# Patient Record
Sex: Female | Born: 1940 | Race: Black or African American | Hispanic: No | Smoking: Never smoker
Health system: Southern US, Community
[De-identification: ages and names within clinical notes are randomized; demographics above are authoritative.]

## PROBLEM LIST (undated history)

## (undated) DIAGNOSIS — I10 Essential (primary) hypertension: Secondary | ICD-10-CM

## (undated) DIAGNOSIS — M1712 Unilateral primary osteoarthritis, left knee: Secondary | ICD-10-CM

## (undated) HISTORY — PX: COLONOSCOPY W/ POLYPECTOMY: SHX1380

---

## 2009-03-15 ENCOUNTER — Encounter: Admission: RE | Admit: 2009-03-15 | Discharge: 2009-03-15 | Payer: Self-pay | Admitting: Internal Medicine

## 2010-02-27 ENCOUNTER — Other Ambulatory Visit: Payer: Self-pay | Admitting: Internal Medicine

## 2010-02-27 DIAGNOSIS — Z1231 Encounter for screening mammogram for malignant neoplasm of breast: Secondary | ICD-10-CM

## 2010-04-03 ENCOUNTER — Ambulatory Visit: Payer: Self-pay

## 2011-04-07 ENCOUNTER — Ambulatory Visit
Admission: RE | Admit: 2011-04-07 | Discharge: 2011-04-07 | Disposition: A | Discharge status: Home / Self Care | Payer: Medicare Other | Source: Ambulatory Visit | Attending: Internal Medicine | Admitting: Internal Medicine

## 2011-04-07 ENCOUNTER — Other Ambulatory Visit: Payer: Self-pay | Admitting: Internal Medicine

## 2011-04-07 DIAGNOSIS — M79606 Pain in leg, unspecified: Secondary | ICD-10-CM

## 2012-09-13 ENCOUNTER — Emergency Department (INDEPENDENT_AMBULATORY_CARE_PROVIDER_SITE_OTHER)
Admission: EM | Admit: 2012-09-13 | Discharge: 2012-09-13 | Disposition: A | Discharge status: Home / Self Care | Payer: Medicare Other | Source: Home / Self Care

## 2012-09-13 ENCOUNTER — Encounter (HOSPITAL_COMMUNITY): Payer: Self-pay | Admitting: *Deleted

## 2012-09-13 DIAGNOSIS — M542 Cervicalgia: Secondary | ICD-10-CM

## 2012-09-13 HISTORY — DX: Essential (primary) hypertension: I10

## 2012-09-13 MED ORDER — TRAMADOL HCL 50 MG PO TABS: 50.0000 mg | ORAL_TABLET | Freq: Four times a day (QID) | ORAL | Status: DC | PRN

## 2012-09-13 NOTE — ED Notes (Signed)
Pt  Reports  r  Shoulder  /  Arm  Pain  Started  Yesterday      Pt  Reports  May  Have  Injured  It  sev days  Ago  When  Playing with  Grandchild     / lifting toy

## 2012-09-13 NOTE — ED Provider Notes (Signed)
Emily Zuniga is a 72 y.o. female who presents to Urgent Care today for right shoulder and right thoracic pain starting yesterday. Patient denies any injury. She knows that she turned over in bed yesterday morning he awoke with sharp pain in her right shoulder girdle. The pain radiates to the upper right thoracic back and down the right arm to the elbow. The pain is worse with neck motion. The pain is not affected by shoulder motion or overhand reaching. She denies any pain radiating below the level of her elbow weakness or numbness or difficulty with coordination. She's tried some old diclofenac that she has for her knee arthritis which is only helped a bit. She denies any fevers or chills and feels well otherwise.    PMH reviewed. Hypertension History  Substance Use Topics  . Smoking status: Never Smoker   . Smokeless tobacco: Not on file  . Alcohol Use: No   ROS as above Medications reviewed. No current facility-administered medications for this encounter.   Current Outpatient Prescriptions  Medication Sig Dispense Refill  . AMLODIPINE BESYLATE PO Take by mouth.      . Calcium Carbonate-Vit D-Min (CALCIUM 1200 PO) Take by mouth.      . ENALAPRIL MALEATE PO Take by mouth.      . traMADol (ULTRAM) 50 MG tablet Take 1 tablet (50 mg total) by mouth every 6 (six) hours as needed for pain.  20 tablet  0    Exam:  BP 177/92  Pulse 60  Temp(Src) 98.3 F (36.8 C) (Oral)  Resp 18  SpO2 98% Gen: Well NAD HEENT: EOMI,  MMM Lungs: CTABL Nl WOB Heart: RRR no MRG Exts: Non edematous BL  LE, warm and well perfused.  Neck: Nontender to spinal midline. Normal range of motion pain with right rotation range of motion. Positive Spurling's test on right..  Shoulder: Bilateral shoulders with normal range of motion. Negative impingement test on right. Grip strength capillary refill and sensation intact bilateral upper extremities  No results found for this or any previous visit (from the past 24  hour(s)). No results found.  Assessment and Plan: 72 y.o. female with cervical myofascial pain. Doubtful for rotator cuff disease.  Plan to continue diclofenac and use tramadol and heating pad for pain as needed.  Followup with sports medicine if not improved in one or 2 weeks Discussed warning signs or symptoms. Please see discharge instructions. Patient expresses understanding.      Rodolph Bong, MD 09/13/12 1201

## 2012-10-11 ENCOUNTER — Other Ambulatory Visit: Payer: Self-pay | Admitting: Internal Medicine

## 2012-10-11 ENCOUNTER — Ambulatory Visit
Admission: RE | Admit: 2012-10-11 | Discharge: 2012-10-11 | Disposition: A | Discharge status: Home / Self Care | Payer: Medicare Other | Source: Ambulatory Visit | Attending: Internal Medicine | Admitting: Internal Medicine

## 2012-10-11 DIAGNOSIS — R200 Anesthesia of skin: Secondary | ICD-10-CM

## 2012-10-11 DIAGNOSIS — M542 Cervicalgia: Secondary | ICD-10-CM

## 2012-10-11 DIAGNOSIS — R52 Pain, unspecified: Secondary | ICD-10-CM

## 2014-03-16 ENCOUNTER — Encounter (HOSPITAL_COMMUNITY): Payer: Self-pay | Admitting: Emergency Medicine

## 2014-03-16 ENCOUNTER — Emergency Department (INDEPENDENT_AMBULATORY_CARE_PROVIDER_SITE_OTHER)
Admission: EM | Admit: 2014-03-16 | Discharge: 2014-03-16 | Disposition: A | Discharge status: Home / Self Care | Payer: Self-pay | Source: Home / Self Care | Attending: Emergency Medicine | Admitting: Emergency Medicine

## 2014-03-16 DIAGNOSIS — S39012A Strain of muscle, fascia and tendon of lower back, initial encounter: Secondary | ICD-10-CM

## 2014-03-16 DIAGNOSIS — R519 Headache, unspecified: Secondary | ICD-10-CM

## 2014-03-16 DIAGNOSIS — R51 Headache: Secondary | ICD-10-CM

## 2014-03-16 MED ORDER — METHOCARBAMOL 500 MG PO TABS: 500.0000 mg | ORAL_TABLET | Freq: Three times a day (TID) | ORAL | Status: DC
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
## 2014-03-16 NOTE — Discharge Instructions (Signed)
Do exercises twice daily followed by moist heat for 15 minutes. ° ° ° ° ° °Try to be as active as possible. ° °If no better in 2 weeks, follow up with orthopedist. ° ° °
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
## 2015-01-10 ENCOUNTER — Emergency Department (HOSPITAL_COMMUNITY)
Admission: EM | Admit: 2015-01-10 | Discharge: 2015-01-11 | Disposition: A | Discharge status: Home / Self Care | Payer: Medicare Other | Attending: Emergency Medicine | Admitting: Emergency Medicine

## 2015-01-10 ENCOUNTER — Other Ambulatory Visit: Payer: Self-pay

## 2015-01-10 ENCOUNTER — Encounter (HOSPITAL_COMMUNITY): Payer: Self-pay | Admitting: *Deleted

## 2015-01-10 DIAGNOSIS — Z79899 Other long term (current) drug therapy: Secondary | ICD-10-CM | POA: Diagnosis not present

## 2015-01-10 DIAGNOSIS — H81399 Other peripheral vertigo, unspecified ear: Secondary | ICD-10-CM | POA: Diagnosis not present

## 2015-01-10 DIAGNOSIS — I10 Essential (primary) hypertension: Secondary | ICD-10-CM | POA: Diagnosis not present

## 2015-01-10 DIAGNOSIS — R42 Dizziness and giddiness: Secondary | ICD-10-CM | POA: Diagnosis present

## 2015-01-10 LAB — BASIC METABOLIC PANEL
Anion gap: 9 (ref 5–15)
BUN: 15 mg/dL (ref 6–20)
CALCIUM: 9.9 mg/dL (ref 8.9–10.3)
CHLORIDE: 98 mmol/L — AB (ref 101–111)
CO2: 31 mmol/L (ref 22–32)
CREATININE: 1.04 mg/dL — AB (ref 0.44–1.00)
GFR, EST NON AFRICAN AMERICAN: 52 mL/min — AB (ref >60)
Glucose, Bld: 134 mg/dL — ABNORMAL HIGH (ref 65–99)
Potassium: 3.2 mmol/L — ABNORMAL LOW (ref 3.5–5.1)
SODIUM: 138 mmol/L (ref 135–145)

## 2015-01-10 LAB — CBC
HCT: 42.5 % (ref 36.0–46.0)
HEMOGLOBIN: 13.9 g/dL (ref 12.0–15.0)
MCH: 30 pg (ref 26.0–34.0)
MCHC: 32.7 g/dL (ref 30.0–36.0)
MCV: 91.8 fL (ref 78.0–100.0)
PLATELETS: 287 10*3/uL (ref 150–400)
RBC: 4.63 MIL/uL (ref 3.87–5.11)
RDW: 12.9 % (ref 11.5–15.5)
WBC: 7.1 10*3/uL (ref 4.0–10.5)

## 2015-01-10 NOTE — ED Notes (Signed)
The pt has had dizziness for the past hour  Now has a sl headache.  She has a history of dizziness.   No nausea no vomiting  She has dizziness when she stands

## 2015-01-10 NOTE — ED Notes (Signed)
No facial droop no arm drift no leg drift.  Equal grips

## 2015-01-10 NOTE — ED Provider Notes (Signed)
CSN: [id number]     Arrival date & time 01/10/15  2203 History     Chief Complaint  Patient presents with  . Dizziness      The history is provided by the patient. No language interpreter was used.   HPI Comments:  Emily Zuniga is a 74 y.o. female who presents to the Emergency Department complaining of dizziness. She has a history of hypertension. States that she has been in her usual state of health, and while watching TV 1 hour prior to arrival she developed sudden onset of dizziness. Stated that she had leaned her head back to rest from watching the TV and felt as if the room was spinning around her. She felt like she if she try to walk she would be very unsteady. States that when she would try to stand up her symptoms would worsen. Did not have any nausea or vomiting, double vision, speech changes, difficulty swallowing, numbness or weakness. No recent fevers, chills,cough, difficulty breathing, chest pain, syncope, or other recent illnesses.    Past Medical History  Diagnosis Date  . Hypertension    History reviewed. No pertinent past surgical history. History reviewed. No pertinent family history. Social History  Substance Use Topics  . Smoking status: Never Smoker   . Smokeless tobacco: None  . Alcohol Use: No   OB History    No data available     Review of Systems  All other systems reviewed and are negative.  10/14 systems reviewed and are negative other than those stated in the HPI    Allergies  Review of patient's allergies indicates no known allergies.  Home Medications   Prior to Admission medications   Medication Sig Start Date End Date Taking? Authorizing Provider  amLODipine (NORVASC) 10 MG tablet Take 10 mg by mouth daily.   Yes Historical Provider, MD  Calcium Carbonate-Vit D-Min (CALCIUM 1200 PO) Take 1 tablet by mouth daily.    Yes Historical Provider, MD  diclofenac (VOLTAREN) 75 MG EC tablet Take 75 mg by mouth 2 (two) times daily as needed for  moderate pain.   Yes Historical Provider, MD  enalapril-hydrochlorothiazide (VASERETIC) 10-25 MG tablet Take 1 tablet by mouth daily.   Yes Historical Provider, MD  meclizine (ANTIVERT) 25 MG tablet Take 1 tablet (25 mg total) by mouth 3 (three) times daily as needed for dizziness. 01/11/15   Lavera Guise, MD   Triage vitals: BP 156/91 mmHg  Pulse 90  Temp(Src) 97.8 F (36.6 C) (Oral)  Resp 20  SpO2 97% Physical Exam  Physical Exam  Nursing note and vitals reviewed. Constitutional: Well developed, well nourished, non-toxic, and in no acute distress Head: Normocephalic and atraumatic.  Mouth/Throat: Oropharynx is clear and moist.  Neck: Normal range of motion. Neck supple.  Cardiovascular: Normal rate and regular rhythm.   Pulmonary/Chest: Effort normal and breath sounds normal.  Abdominal: Soft. There is no tenderness. There is no rebound and no guarding.  Musculoskeletal: Normal range of motion.  Skin: Skin is warm and dry.  Psychiatric: Cooperative Neurological:  Alert, oriented to person, place, time, and situation. Memory grossly in tact. Fluent speech. No dysarthria or aphasia.  Cranial nerves: VF are full. Pupils are symmetric, and reactive to light. EOMI without nystagmus. No gaze deviation. Facial muscles symmetric with activation. Sensation to light touch over face in tact bilaterally. Hearing grossly in tact. Palate elevates symmetrically. Head turn and shoulder shrug are intact. Tongue midline.  Reflexes defered.  Muscle bulk and tone  normal. No pronator drift. Moves all extremities symmetrically. Sensation to light touch is in tact throughout in bilateral upper and lower extremities. Coordination reveals no dysmetria with finger to nose. Gait is narrow-based and steady but cautious   ED Course  Procedures  DIAGNOSTIC STUDIES: Oxygen Saturation is 97% on RA, normal by my interpretation.  COORDINATION OF CARE:  12:13 AM Discussed treatment plan with pt at bedside and  pt agreed to plan.  Labs Review Labs Reviewed  BASIC METABOLIC PANEL - Abnormal; Notable for the following:    Potassium 3.2 (*)    Chloride 98 (*)    Glucose, Bld 134 (*)    Creatinine, Ser 1.04 (*)    GFR calc non Af Amer 52 (*)    All other components within normal limits  CBC    Imaging Review No results found. I have personally reviewed and evaluated these images and lab results as part of my medical decision-making.   EKG Interpretation   Date/Time:  Thursday January 10 2015 22:09:45 EST Ventricular Rate:  85 PR Interval:  176 QRS Duration: 76 QT Interval:  378 QTC Calculation: 449 R Axis:   46 Text Interpretation:  Normal sinus rhythm Normal ECG No prior EKG.   Confirmed by Cyanne Delmar MD, Annabelle HarmanANA [phone]([id number]) on 01/11/2015 5:00:47 AM      MDM   Final diagnoses:  Peripheral vertigo, unspecified laterality   74 year old female who presents with dizziness, consistent with that of likely peripheral vertigo. Has minimal residual vertigo on my examination. No physical exam findings such as dysmetria, significant gait instability, abnormal vertical or bidirectional nystagmus, or other neurological deficits that would suggest central etiology of this. Given dose of meclizine, symptoms improved. Able to ambulate to restroom. Did briefly have recurrence of vertigo she reports when getting up from the toilet, but symptoms did improve on their own afterwards. States that she is comfortable with discharge home at this point. Given instructions for maneuvers at home as well as given a course of meclizine. Strict return and follow-up instructions are reviewed. She expressed understanding of all discharge instructions and felt comfortable to plan of care.   Lavera Guiseana Duo Chloe Miyoshi, MD 01/11/15 [phone]

## 2015-01-11 ENCOUNTER — Encounter (HOSPITAL_COMMUNITY): Payer: Self-pay | Admitting: Emergency Medicine

## 2015-01-11 MED ORDER — POTASSIUM CHLORIDE CRYS ER 20 MEQ PO TBCR
40.0000 meq | EXTENDED_RELEASE_TABLET | Freq: Once | ORAL | Status: AC
  Administered 2015-01-11: 40 meq via ORAL
  Filled 2015-01-11: qty 2

## 2015-01-11 MED ORDER — MECLIZINE HCL 25 MG PO TABS
25.0000 mg | ORAL_TABLET | Freq: Once | ORAL | Status: AC
  Administered 2015-01-11: 25 mg via ORAL
  Filled 2015-01-11: qty 1

## 2015-01-11 MED ORDER — MECLIZINE HCL 25 MG PO TABS: 25.0000 mg | ORAL_TABLET | Freq: Three times a day (TID) | ORAL | Status: DC | PRN

## 2015-01-11 NOTE — ED Notes (Signed)
Pt stable, ambulatory, states understanding of discharge instructions 

## 2015-01-11 NOTE — Discharge Instructions (Signed)
Return without fail for worsening symptoms, including difficulty speaking, vision changes, numbness or weakness, confusion, inability to walk, or any other symptoms concerning to you.  Benign Positional Vertigo Vertigo is the feeling that you or your surroundings are moving when they are not. Benign positional vertigo is the most common form of vertigo. The cause of this condition is not serious (is benign). This condition is triggered by certain movements and positions (is positional). This condition can be dangerous if it occurs while you are doing something that could endanger you or others, such as driving.  CAUSES In many cases, the cause of this condition is not known. It may be caused by a disturbance in an area of the inner ear that helps your brain to sense movement and balance. This disturbance can be caused by a viral infection (labyrinthitis), head injury, or repetitive motion. RISK FACTORS This condition is more likely to develop in: 1. Women. 2. People who are 56 years of age or older. SYMPTOMS Symptoms of this condition usually happen when you move your head or your eyes in different directions. Symptoms may start suddenly, and they usually last for less than a minute. Symptoms may include:  Loss of balance and falling.  Feeling like you are spinning or moving.  Feeling like your surroundings are spinning or moving.  Nausea and vomiting.  Blurred vision.  Dizziness.  Involuntary eye movement (nystagmus). Symptoms can be mild and cause only slight annoyance, or they can be severe and interfere with daily life. Episodes of benign positional vertigo may return (recur) over time, and they may be triggered by certain movements. Symptoms may improve over time. DIAGNOSIS This condition is usually diagnosed by medical history and a physical exam of the head, neck, and ears. You may be referred to a health care provider who specializes in ear, nose, and throat (ENT) problems  (otolaryngologist) or a provider who specializes in disorders of the nervous system (neurologist). You may have additional testing, including:  MRI.  A CT scan.  Eye movement tests. Your health care provider may ask you to change positions quickly while he or she watches you for symptoms of benign positional vertigo, such as nystagmus. Eye movement may be tested with an electronystagmogram (ENG), caloric stimulation, the Dix-Hallpike test, or the roll test.  An electroencephalogram (EEG). This records electrical activity in your brain.  Hearing tests. TREATMENT Usually, your health care provider will treat this by moving your head in specific positions to adjust your inner ear back to normal. Surgery may be needed in severe cases, but this is rare. In some cases, benign positional vertigo may resolve on its own in 2-4 weeks. HOME CARE INSTRUCTIONS Safety  Move slowly.Avoid sudden body or head movements.  Avoid driving.  Avoid operating heavy machinery.  Avoid doing any tasks that would be dangerous to you or others if a vertigo episode would occur.  If you have trouble walking or keeping your balance, try using a cane for stability. If you feel dizzy or unstable, sit down right away.  Return to your normal activities as told by your health care provider. Ask your health care provider what activities are safe for you. General Instructions  Take over-the-counter and prescription medicines only as told by your health care provider.  Avoid certain positions or movements as told by your health care provider.  Drink enough fluid to keep your urine clear or pale yellow.  Keep all follow-up visits as told by your health care provider. This  is important. SEEK MEDICAL CARE IF:  You have a fever.  Your condition gets worse or you develop new symptoms.  Your family or friends notice any behavioral changes.  Your nausea or vomiting gets worse.  You have numbness or a "pins and  needles" sensation. SEEK IMMEDIATE MEDICAL CARE IF:  You have difficulty speaking or moving.  You are always dizzy.  You faint.  You develop severe headaches.  You have weakness in your legs or arms.  You have changes in your hearing or vision.  You develop a stiff neck.  You develop sensitivity to light.   This information is not intended to replace advice given to you by your health care provider. Make sure you discuss any questions you have with your health care provider.   Document Released: 10/13/2005 Document Revised: 09/26/2014 Document Reviewed: 04/30/2014 Elsevier Interactive Patient Education 2016 [employer]  Teaching laboratory technicianpley Maneuver Self-Care WHAT IS THE EPLEY MANEUVER? The Epley maneuver is an exercise you can do to relieve symptoms of benign paroxysmal positional vertigo (BPPV). This condition is often just referred to as vertigo. BPPV is caused by the movement of tiny crystals (canaliths) inside your inner ear. The accumulation and movement of canaliths in your inner ear causes a sudden spinning sensation (vertigo) when you move your head to certain positions. Vertigo usually lasts about 30 seconds. BPPV usually occurs in just one ear. If you get vertigo when you lie on your left side, you probably have BPPV in your left ear. Your health care provider can tell you which ear is involved.  BPPV may be caused by a head injury. Many people older than 50 get BPPV for unknown reasons. If you have been diagnosed with BPPV, your health care provider may teach you how to do this maneuver. BPPV is not life threatening (benign) and usually goes away in time.  WHEN SHOULD I PERFORM THE EPLEY MANEUVER? You can do this maneuver at home whenever you have symptoms of vertigo. You may do the Epley maneuver up to 3 times a day until your symptoms of vertigo go away. HOW SHOULD I DO THE EPLEY MANEUVER? 3. Sit on the edge of a bed or table with your back straight. Your legs should be extended or  hanging over the edge of the bed or table.  4. Turn your head halfway toward the affected ear.  5. Lie backward quickly with your head turned until you are lying flat on your back. You may want to position a pillow under your shoulders.  6. Hold this position for 30 seconds. You may experience an attack of vertigo. This is normal. Hold this position until the vertigo stops. 7. Then turn your head to the opposite direction until your unaffected ear is facing the floor.  8. Hold this position for 30 seconds. You may experience an attack of vertigo. This is normal. Hold this position until the vertigo stops. 9. Now turn your whole body to the same side as your head. Hold for another 30 seconds.  10. You can then sit back up. ARE THERE RISKS TO THIS MANEUVER? In some cases, you may have other symptoms (such as changes in your vision, weakness, or numbness). If you have these symptoms, stop doing the maneuver and call your health care provider. Even if doing these maneuvers relieves your vertigo, you may still have dizziness. Dizziness is the sensation of light-headedness but without the sensation of movement. Even though the Epley maneuver may relieve your vertigo, it is  possible that your symptoms will return within 5 years. WHAT SHOULD I DO AFTER THIS MANEUVER? After doing the Epley maneuver, you can return to your normal activities. Ask your doctor if there is anything you should do at home to prevent vertigo. This may include:  Sleeping with two or more pillows to keep your head elevated.  Not sleeping on the side of your affected ear.  Getting up slowly from bed.  Avoiding sudden movements during the day.  Avoiding extreme head movement, like looking up or bending over.  Wearing a cervical collar to prevent sudden head movements. WHAT SHOULD I DO IF MY SYMPTOMS GET WORSE? Call your health care provider if your vertigo gets worse. Call your provider right way if you have other symptoms,  including:   Nausea.  Vomiting.  Headache.  Weakness.  Numbness.  Vision changes.   This information is not intended to replace advice given to you by your health care provider. Make sure you discuss any questions you have with your health care provider.   Document Released: 01/10/2013 Document Reviewed: 01/10/2013 Elsevier Interactive Patient Education [employer]

## 2017-02-24 ENCOUNTER — Other Ambulatory Visit: Payer: Self-pay | Admitting: Internal Medicine

## 2017-02-24 ENCOUNTER — Ambulatory Visit
Admission: RE | Admit: 2017-02-24 | Discharge: 2017-02-24 | Disposition: A | Discharge status: Home / Self Care | Payer: Medicare HMO | Source: Ambulatory Visit | Attending: Internal Medicine | Admitting: Internal Medicine

## 2017-02-24 DIAGNOSIS — R05 Cough: Secondary | ICD-10-CM

## 2017-02-24 DIAGNOSIS — R059 Cough, unspecified: Secondary | ICD-10-CM

## 2017-05-06 ENCOUNTER — Other Ambulatory Visit: Payer: Self-pay | Admitting: Orthopedic Surgery

## 2017-05-27 NOTE — Pre-Procedure Instructions (Signed)
Emily Zuniga  05/27/2017      [facility] [place], [place] - [address] [place] [address] Phone: [phone] Fax: [phone]    Your procedure is scheduled on Tues., Jun 08, 2017  Report to [facility] Admitting Entrance "A" at 5:30AM  Call this number if you have problems the morning of surgery:  [phone]   Remember:  Do not eat food or drink liquids after midnight.  Take these medicines the morning of surgery with A SIP OF WATER: AmLODipine (NORVASC)  If needed Acetaminophen (TYLENOL) for pain.  7 days before surgery (5/14), stop taking all Aspirins, Vitamins, Fish oils, and Herbal medications. Also stop all NSAIDS i.e. Advil, Ibuprofen, Motrin, Aleve, Anaprox, Naproxen, BC and Goody Powders.   Do not wear jewelry, make-up or nail polish.  Do not wear lotions, powders, perfumes, or deodorant.  Do not shave 48 hours prior to surgery.    Do not bring valuables to the hospital.  [facility] is not responsible for any belongings or valuables.  Contacts, dentures or bridgework may not be worn into surgery.  Leave your suitcase in the car.  After surgery it may be brought to your room.  For patients admitted to the hospital, discharge time will be determined by your treatment team.  Patients discharged the day of surgery will not be allowed to drive home.   Special instructions: [place]- Preparing For Surgery  Before surgery, you can play an important role. Because skin is not sterile, your skin needs to be as free of germs as possible. You can reduce the number of germs on your skin by washing with CHG (chlorahexidine gluconate) Soap before surgery.  CHG is an antiseptic cleaner which kills germs and bonds with the skin to continue killing germs even after washing.  Oral Hygiene is also important to reduce your risk of infection.  Remember - BRUSH YOUR TEETH THE MORNING OF SURGERY  Please do not use if  you have an allergy to CHG or antibacterial soaps. If your skin becomes reddened/irritated stop using the CHG.  Do not shave (including legs and underarms) for at least 48 hours prior to first CHG shower. It is OK to shave your face.  Please follow these instructions carefully.   1. Shower the NIGHT BEFORE SURGERY and the MORNING OF SURGERY with CHG.   2. If you chose to wash your hair, wash your hair first as usual with your normal shampoo.  3. After you shampoo, rinse your hair and body thoroughly to remove the shampoo.  4. Use CHG as you would any other liquid soap. You can apply CHG directly to the skin and wash gently with a scrungie or a clean washcloth.   5. Apply the CHG Soap to your body ONLY FROM THE NECK DOWN.  Do not use on open wounds or open sores. Avoid contact with your eyes, ears, mouth and genitals (private parts). Wash Face and genitals (private parts)  with your normal soap.  6. Wash thoroughly, paying special attention to the area where your surgery will be performed.  7. Thoroughly rinse your body with warm water from the neck down.  8. DO NOT shower/wash with your normal soap after using and rinsing off the CHG Soap.  9. Pat yourself dry with a CLEAN TOWEL.  10. Wear CLEAN PAJAMAS to bed the night before surgery, wear comfortable clothes the morning of surgery  11. Place CLEAN SHEETS  on your bed the night of your first shower and DO NOT SLEEP WITH PETS.  Day of Surgery: Do not apply any deodorants/lotions. Please wear clean clothes to the hospital/surgery center.  Remember to brush your teeth.    Please read over the following fact sheets that you were given. Pain Booklet, Coughing and Deep Breathing, Total Joint Packet, MRSA Information and Surgical Site Infection Prevention

## 2017-05-28 ENCOUNTER — Other Ambulatory Visit: Payer: Self-pay

## 2017-05-28 ENCOUNTER — Encounter (HOSPITAL_COMMUNITY): Payer: Self-pay

## 2017-05-28 ENCOUNTER — Encounter (HOSPITAL_COMMUNITY)
Admission: RE | Admit: 2017-05-28 | Discharge: 2017-05-28 | Disposition: A | Discharge status: Home / Self Care | Payer: Medicare HMO | Source: Ambulatory Visit | Attending: Orthopedic Surgery | Admitting: Orthopedic Surgery

## 2017-05-28 DIAGNOSIS — Z01812 Encounter for preprocedural laboratory examination: Secondary | ICD-10-CM | POA: Diagnosis present

## 2017-05-28 DIAGNOSIS — Z01810 Encounter for preprocedural cardiovascular examination: Secondary | ICD-10-CM | POA: Insufficient documentation

## 2017-05-28 HISTORY — DX: Unilateral primary osteoarthritis, left knee: M17.12

## 2017-05-28 LAB — BASIC METABOLIC PANEL
ANION GAP: 10 (ref 5–15)
BUN: 12 mg/dL (ref 6–20)
CHLORIDE: 102 mmol/L (ref 101–111)
CO2: 29 mmol/L (ref 22–32)
Calcium: 10 mg/dL (ref 8.9–10.3)
Creatinine, Ser: 1 mg/dL (ref 0.44–1.00)
GFR calc non Af Amer: 54 mL/min — ABNORMAL LOW (ref >60)
Glucose, Bld: 98 mg/dL (ref 65–99)
POTASSIUM: 3.7 mmol/L (ref 3.5–5.1)
Sodium: 141 mmol/L (ref 135–145)

## 2017-05-28 LAB — CBC
HCT: 43.1 % (ref 36.0–46.0)
HEMOGLOBIN: 14.3 g/dL (ref 12.0–15.0)
MCH: 30 pg (ref 26.0–34.0)
MCHC: 33.2 g/dL (ref 30.0–36.0)
MCV: 90.5 fL (ref 78.0–100.0)
Platelets: 243 10*3/uL (ref 150–400)
RBC: 4.76 MIL/uL (ref 3.87–5.11)
RDW: 12.7 % (ref 11.5–15.5)
WBC: 4.9 10*3/uL (ref 4.0–10.5)

## 2017-05-28 LAB — SURGICAL PCR SCREEN
MRSA, PCR: NEGATIVE
Staphylococcus aureus: NEGATIVE

## 2017-05-28 NOTE — Progress Notes (Signed)
PCP - Dr. Burton Apley  Cardiologist - Denies  Chest x-ray - Denies  EKG - 05/28/17  Stress Test - Denies  ECHO - Denies  Cardiac Cath - Denies  Sleep Study - Denies CPAP - None  LABS- 05/28/17: CBC, BMP   Anesthesia- No  Pt denies having chest pain, sob, or fever at this time. All instructions explained to the pt, with a verbal understanding of the material. Pt agrees to go over the instructions while at home for a better understanding. The opportunity to ask questions was provided.

## 2017-06-08 ENCOUNTER — Inpatient Hospital Stay (HOSPITAL_COMMUNITY): Payer: Medicare HMO | Admitting: Anesthesiology

## 2017-06-08 ENCOUNTER — Inpatient Hospital Stay (HOSPITAL_COMMUNITY): Payer: Medicare HMO

## 2017-06-08 ENCOUNTER — Encounter (HOSPITAL_COMMUNITY)
Admission: RE | Disposition: A | Discharge status: Home Health | Payer: Self-pay | Source: Ambulatory Visit | Attending: Orthopedic Surgery

## 2017-06-08 ENCOUNTER — Encounter (HOSPITAL_COMMUNITY): Payer: Self-pay | Admitting: Orthopedic Surgery

## 2017-06-08 ENCOUNTER — Other Ambulatory Visit: Payer: Self-pay

## 2017-06-08 ENCOUNTER — Inpatient Hospital Stay (HOSPITAL_COMMUNITY)
Admission: RE | Admit: 2017-06-08 | Discharge: 2017-06-10 | DRG: 470 | Disposition: A | Discharge status: Home Health | Payer: Medicare HMO | Source: Ambulatory Visit | Attending: Orthopedic Surgery | Admitting: Orthopedic Surgery

## 2017-06-08 DIAGNOSIS — Z96652 Presence of left artificial knee joint: Secondary | ICD-10-CM

## 2017-06-08 DIAGNOSIS — M1712 Unilateral primary osteoarthritis, left knee: Secondary | ICD-10-CM | POA: Diagnosis present

## 2017-06-08 DIAGNOSIS — Z8249 Family history of ischemic heart disease and other diseases of the circulatory system: Secondary | ICD-10-CM | POA: Diagnosis not present

## 2017-06-08 DIAGNOSIS — Z6841 Body Mass Index (BMI) 40.0 and over, adult: Secondary | ICD-10-CM

## 2017-06-08 DIAGNOSIS — I10 Essential (primary) hypertension: Secondary | ICD-10-CM | POA: Diagnosis present

## 2017-06-08 DIAGNOSIS — Z96659 Presence of unspecified artificial knee joint: Secondary | ICD-10-CM

## 2017-06-08 HISTORY — DX: Unilateral primary osteoarthritis, left knee: M17.12

## 2017-06-08 HISTORY — PX: TOTAL KNEE ARTHROPLASTY: SHX125

## 2017-06-08 SURGERY — ARTHROPLASTY, KNEE, TOTAL
Anesthesia: Spinal | Site: Knee | Laterality: Left

## 2017-06-08 MED ORDER — ACETAMINOPHEN 325 MG PO TABS
325.0000 mg | ORAL_TABLET | Freq: Four times a day (QID) | ORAL | Status: DC | PRN
  Administered 2017-06-09 – 2017-06-10 (×2): 650 mg via ORAL
  Filled 2017-06-08 (×2): qty 2

## 2017-06-08 MED ORDER — HYDROCHLOROTHIAZIDE 25 MG PO TABS
25.0000 mg | ORAL_TABLET | Freq: Every day | ORAL | Status: DC
  Administered 2017-06-08 – 2017-06-10 (×3): 25 mg via ORAL
  Filled 2017-06-08 (×3): qty 1

## 2017-06-08 MED ORDER — BACLOFEN 10 MG PO TABS: 10.0000 mg | ORAL_TABLET | Freq: Three times a day (TID) | ORAL | 0 refills | Status: DC

## 2017-06-08 MED ORDER — DEXAMETHASONE SODIUM PHOSPHATE 10 MG/ML IJ SOLN
INTRAMUSCULAR | Status: AC
  Filled 2017-06-08: qty 1

## 2017-06-08 MED ORDER — METOCLOPRAMIDE HCL 5 MG/ML IJ SOLN: 5.0000 mg | Freq: Three times a day (TID) | INTRAMUSCULAR | Status: DC | PRN

## 2017-06-08 MED ORDER — 0.9 % SODIUM CHLORIDE (POUR BTL) OPTIME
TOPICAL | Status: DC | PRN
  Administered 2017-06-08: 1000 mL

## 2017-06-08 MED ORDER — LIDOCAINE 2% (20 MG/ML) 5 ML SYRINGE
INTRAMUSCULAR | Status: AC
  Filled 2017-06-08: qty 5

## 2017-06-08 MED ORDER — LACTATED RINGERS IV SOLN
INTRAVENOUS | Status: DC | PRN
  Administered 2017-06-08 (×2): via INTRAVENOUS

## 2017-06-08 MED ORDER — DOCUSATE SODIUM 100 MG PO CAPS
100.0000 mg | ORAL_CAPSULE | Freq: Two times a day (BID) | ORAL | Status: DC
  Administered 2017-06-08 – 2017-06-10 (×4): 100 mg via ORAL
  Filled 2017-06-08 (×4): qty 1

## 2017-06-08 MED ORDER — KETOROLAC TROMETHAMINE 15 MG/ML IJ SOLN
7.5000 mg | Freq: Four times a day (QID) | INTRAMUSCULAR | Status: AC
  Administered 2017-06-08 – 2017-06-09 (×4): 7.5 mg via INTRAVENOUS
  Filled 2017-06-08 (×4): qty 1

## 2017-06-08 MED ORDER — MIDAZOLAM HCL 5 MG/5ML IJ SOLN
INTRAMUSCULAR | Status: DC | PRN
  Administered 2017-06-08: 1 mg via INTRAVENOUS

## 2017-06-08 MED ORDER — CALCIUM CARBONATE-VITAMIN D 500-200 MG-UNIT PO TABS
1.0000 | ORAL_TABLET | Freq: Every day | ORAL | Status: DC
  Administered 2017-06-08 – 2017-06-10 (×3): 1 via ORAL
  Filled 2017-06-08 (×6): qty 1

## 2017-06-08 MED ORDER — BUPIVACAINE HCL (PF) 0.25 % IJ SOLN
INTRAMUSCULAR | Status: DC | PRN
  Administered 2017-06-08: 30 mL

## 2017-06-08 MED ORDER — BUPIVACAINE HCL (PF) 0.25 % IJ SOLN
INTRAMUSCULAR | Status: AC
  Filled 2017-06-08: qty 30

## 2017-06-08 MED ORDER — ALUM & MAG HYDROXIDE-SIMETH 200-200-20 MG/5ML PO SUSP: 30.0000 mL | ORAL | Status: DC | PRN

## 2017-06-08 MED ORDER — ENALAPRIL-HYDROCHLOROTHIAZIDE 10-25 MG PO TABS: 1.0000 | ORAL_TABLET | Freq: Every day | ORAL | Status: DC

## 2017-06-08 MED ORDER — ENALAPRIL MALEATE 5 MG PO TABS
10.0000 mg | ORAL_TABLET | Freq: Every day | ORAL | Status: DC
  Administered 2017-06-08 – 2017-06-10 (×3): 10 mg via ORAL
  Filled 2017-06-08 (×3): qty 2

## 2017-06-08 MED ORDER — ASPIRIN EC 325 MG PO TBEC
325.0000 mg | DELAYED_RELEASE_TABLET | Freq: Two times a day (BID) | ORAL | Status: DC
  Administered 2017-06-08 – 2017-06-10 (×4): 325 mg via ORAL
  Filled 2017-06-08 (×4): qty 1

## 2017-06-08 MED ORDER — ZOLPIDEM TARTRATE 5 MG PO TABS: 5.0000 mg | ORAL_TABLET | Freq: Every evening | ORAL | Status: DC | PRN

## 2017-06-08 MED ORDER — TRANEXAMIC ACID 1000 MG/10ML IV SOLN
1000.0000 mg | Freq: Once | INTRAVENOUS | Status: AC
  Administered 2017-06-08: 1000 mg via INTRAVENOUS
  Filled 2017-06-08: qty 10

## 2017-06-08 MED ORDER — POTASSIUM CHLORIDE IN NACL 20-0.45 MEQ/L-% IV SOLN
INTRAVENOUS | Status: DC
  Administered 2017-06-08: 13:00:00 via INTRAVENOUS
  Filled 2017-06-08 (×2): qty 1000

## 2017-06-08 MED ORDER — KETOROLAC TROMETHAMINE 30 MG/ML IJ SOLN
INTRAMUSCULAR | Status: DC | PRN
  Administered 2017-06-08: 30 mg via INTRAMUSCULAR

## 2017-06-08 MED ORDER — AMLODIPINE BESYLATE 5 MG PO TABS
5.0000 mg | ORAL_TABLET | Freq: Every day | ORAL | Status: DC
  Administered 2017-06-09 – 2017-06-10 (×2): 5 mg via ORAL
  Filled 2017-06-08 (×2): qty 1

## 2017-06-08 MED ORDER — ACETAMINOPHEN 500 MG PO TABS
500.0000 mg | ORAL_TABLET | Freq: Four times a day (QID) | ORAL | Status: AC
  Administered 2017-06-09 (×2): 500 mg via ORAL
  Filled 2017-06-08 (×3): qty 1

## 2017-06-08 MED ORDER — ASPIRIN EC 325 MG PO TBEC: 325.0000 mg | DELAYED_RELEASE_TABLET | Freq: Every day | ORAL | 0 refills | Status: DC

## 2017-06-08 MED ORDER — METHOCARBAMOL 500 MG PO TABS
500.0000 mg | ORAL_TABLET | Freq: Four times a day (QID) | ORAL | Status: DC | PRN
  Administered 2017-06-08 – 2017-06-10 (×4): 500 mg via ORAL
  Filled 2017-06-08 (×4): qty 1

## 2017-06-08 MED ORDER — CEFAZOLIN SODIUM-DEXTROSE 2-4 GM/100ML-% IV SOLN
2.0000 g | Freq: Four times a day (QID) | INTRAVENOUS | Status: AC
  Administered 2017-06-08 (×2): 2 g via INTRAVENOUS
  Filled 2017-06-08 (×2): qty 100

## 2017-06-08 MED ORDER — CEFAZOLIN SODIUM-DEXTROSE 2-4 GM/100ML-% IV SOLN
2.0000 g | INTRAVENOUS | Status: AC
  Administered 2017-06-08: 2 g via INTRAVENOUS

## 2017-06-08 MED ORDER — ONDANSETRON HCL 4 MG/2ML IJ SOLN: 4.0000 mg | Freq: Four times a day (QID) | INTRAMUSCULAR | Status: DC | PRN

## 2017-06-08 MED ORDER — LIDOCAINE HCL (CARDIAC) PF 100 MG/5ML IV SOSY
PREFILLED_SYRINGE | INTRAVENOUS | Status: DC | PRN
  Administered 2017-06-08: 40 mg via INTRATRACHEAL

## 2017-06-08 MED ORDER — ONDANSETRON HCL 4 MG PO TABS: 4.0000 mg | ORAL_TABLET | Freq: Three times a day (TID) | ORAL | 0 refills | Status: DC | PRN

## 2017-06-08 MED ORDER — SODIUM CHLORIDE 0.9 % IR SOLN
Status: DC | PRN
  Administered 2017-06-08: 3000 mL

## 2017-06-08 MED ORDER — CHLORHEXIDINE GLUCONATE 4 % EX LIQD: 60.0000 mL | Freq: Once | CUTANEOUS | Status: DC

## 2017-06-08 MED ORDER — PROPOFOL 500 MG/50ML IV EMUL
INTRAVENOUS | Status: DC | PRN
  Administered 2017-06-08: 75 ug/kg/min via INTRAVENOUS
  Administered 2017-06-08: 09:00:00 via INTRAVENOUS

## 2017-06-08 MED ORDER — CEFAZOLIN SODIUM-DEXTROSE 2-4 GM/100ML-% IV SOLN
INTRAVENOUS | Status: AC
  Filled 2017-06-08: qty 100

## 2017-06-08 MED ORDER — ADULT MULTIVITAMIN W/MINERALS CH
1.0000 | ORAL_TABLET | Freq: Every day | ORAL | Status: DC
  Administered 2017-06-08 – 2017-06-10 (×3): 1 via ORAL
  Filled 2017-06-08 (×6): qty 1

## 2017-06-08 MED ORDER — KETOROLAC TROMETHAMINE 30 MG/ML IJ SOLN
INTRAMUSCULAR | Status: AC
  Filled 2017-06-08: qty 1

## 2017-06-08 MED ORDER — PROPOFOL 10 MG/ML IV BOLUS
INTRAVENOUS | Status: AC
  Filled 2017-06-08: qty 20

## 2017-06-08 MED ORDER — PHENOL 1.4 % MT LIQD: 1.0000 | OROMUCOSAL | Status: DC | PRN

## 2017-06-08 MED ORDER — PHENYLEPHRINE HCL 10 MG/ML IJ SOLN
INTRAVENOUS | Status: DC | PRN
  Administered 2017-06-08: 25 ug/min via INTRAVENOUS

## 2017-06-08 MED ORDER — ONDANSETRON HCL 4 MG/2ML IJ SOLN
INTRAMUSCULAR | Status: DC | PRN
  Administered 2017-06-08: 4 mg via INTRAVENOUS

## 2017-06-08 MED ORDER — HYDROCODONE-ACETAMINOPHEN 5-325 MG PO TABS
1.0000 | ORAL_TABLET | ORAL | Status: DC | PRN
  Administered 2017-06-08 – 2017-06-10 (×6): 2 via ORAL
  Filled 2017-06-08 (×6): qty 2

## 2017-06-08 MED ORDER — FENTANYL CITRATE (PF) 100 MCG/2ML IJ SOLN
INTRAMUSCULAR | Status: DC | PRN
  Administered 2017-06-08: 50 ug via INTRAVENOUS

## 2017-06-08 MED ORDER — MIDAZOLAM HCL 2 MG/2ML IJ SOLN
INTRAMUSCULAR | Status: AC
  Filled 2017-06-08: qty 2

## 2017-06-08 MED ORDER — SENNA-DOCUSATE SODIUM 8.6-50 MG PO TABS: 2.0000 | ORAL_TABLET | Freq: Every day | ORAL | 1 refills | Status: DC

## 2017-06-08 MED ORDER — DIPHENHYDRAMINE HCL 12.5 MG/5ML PO ELIX: 12.5000 mg | ORAL_SOLUTION | ORAL | Status: DC | PRN

## 2017-06-08 MED ORDER — HYDROCODONE-ACETAMINOPHEN 7.5-325 MG PO TABS
1.0000 | ORAL_TABLET | ORAL | Status: DC | PRN
  Administered 2017-06-10: 2 via ORAL
  Filled 2017-06-08: qty 2

## 2017-06-08 MED ORDER — FENTANYL CITRATE (PF) 250 MCG/5ML IJ SOLN
INTRAMUSCULAR | Status: AC
  Filled 2017-06-08: qty 5

## 2017-06-08 MED ORDER — ONDANSETRON HCL 4 MG PO TABS: 4.0000 mg | ORAL_TABLET | Freq: Four times a day (QID) | ORAL | Status: DC | PRN

## 2017-06-08 MED ORDER — BUPIVACAINE IN DEXTROSE 0.75-8.25 % IT SOLN
INTRATHECAL | Status: DC | PRN
  Administered 2017-06-08: 1.6 mL via INTRATHECAL

## 2017-06-08 MED ORDER — BUPIVACAINE-EPINEPHRINE (PF) 0.5% -1:200000 IJ SOLN
INTRAMUSCULAR | Status: DC | PRN
  Administered 2017-06-08: 30 mL via PERINEURAL

## 2017-06-08 MED ORDER — GABAPENTIN 300 MG PO CAPS
300.0000 mg | ORAL_CAPSULE | Freq: Three times a day (TID) | ORAL | Status: DC
  Administered 2017-06-08 – 2017-06-10 (×6): 300 mg via ORAL
  Filled 2017-06-08 (×6): qty 1

## 2017-06-08 MED ORDER — HYDROCODONE-ACETAMINOPHEN 10-325 MG PO TABS: 1.0000 | ORAL_TABLET | Freq: Four times a day (QID) | ORAL | 0 refills | Status: DC | PRN

## 2017-06-08 MED ORDER — MAGNESIUM CITRATE PO SOLN: 1.0000 | Freq: Once | ORAL | Status: DC | PRN

## 2017-06-08 MED ORDER — MENTHOL 3 MG MT LOZG: 1.0000 | LOZENGE | OROMUCOSAL | Status: DC | PRN

## 2017-06-08 MED ORDER — METHOCARBAMOL 1000 MG/10ML IJ SOLN: 500.0000 mg | Freq: Four times a day (QID) | INTRAVENOUS | Status: DC | PRN

## 2017-06-08 MED ORDER — BISACODYL 10 MG RE SUPP: 10.0000 mg | Freq: Every day | RECTAL | Status: DC | PRN

## 2017-06-08 MED ORDER — METOCLOPRAMIDE HCL 5 MG PO TABS: 5.0000 mg | ORAL_TABLET | Freq: Three times a day (TID) | ORAL | Status: DC | PRN

## 2017-06-08 MED ORDER — POLYETHYLENE GLYCOL 3350 17 G PO PACK: 17.0000 g | PACK | Freq: Every day | ORAL | Status: DC | PRN

## 2017-06-08 MED ORDER — MORPHINE SULFATE (PF) 2 MG/ML IV SOLN: 0.5000 mg | INTRAVENOUS | Status: DC | PRN

## 2017-06-08 MED ORDER — ONDANSETRON HCL 4 MG/2ML IJ SOLN
INTRAMUSCULAR | Status: AC
  Filled 2017-06-08: qty 2

## 2017-06-08 MED ORDER — DEXAMETHASONE SODIUM PHOSPHATE 10 MG/ML IJ SOLN
10.0000 mg | Freq: Once | INTRAMUSCULAR | Status: AC
  Administered 2017-06-09: 10 mg via INTRAVENOUS
  Filled 2017-06-08: qty 1

## 2017-06-08 MED ORDER — DEXAMETHASONE SODIUM PHOSPHATE 10 MG/ML IJ SOLN
INTRAMUSCULAR | Status: DC | PRN
  Administered 2017-06-08: 5 mg via INTRAVENOUS

## 2017-06-08 SURGICAL SUPPLY — 58 items
BANDAGE ESMARK 6X9 LF (GAUZE/BANDAGES/DRESSINGS) ×1 IMPLANT
BEARING TIBIAL VG PS 63/67X10 (Joint) ×1 IMPLANT
BLADE SAG 18X100X1.27 (BLADE) ×3 IMPLANT
BLADE SAW SGTL 13X75X1.27 (BLADE) ×3 IMPLANT
BNDG ELASTIC 6X10 VLCR STRL LF (GAUZE/BANDAGES/DRESSINGS) ×3 IMPLANT
BNDG ESMARK 6X9 LF (GAUZE/BANDAGES/DRESSINGS) ×3
BOWL SMART MIX CTS (DISPOSABLE) ×3 IMPLANT
CEMENT BONE R 1X40 (Cement) ×6 IMPLANT
CEMENT HV SMART SET (Cement) IMPLANT
CLOSURE STERI-STRIP 1/2X4 (GAUZE/BANDAGES/DRESSINGS) ×1
CLSR STERI-STRIP ANTIMIC 1/2X4 (GAUZE/BANDAGES/DRESSINGS) ×2 IMPLANT
COMP FEM VG IL 65 LT (Knees) ×3 IMPLANT
COMPONENT FEM VG IL 65 LT (Knees) ×1 IMPLANT
COVER SURGICAL LIGHT HANDLE (MISCELLANEOUS) ×3 IMPLANT
CUFF TOURNIQUET SINGLE 34IN LL (TOURNIQUET CUFF) IMPLANT
DISTAL FEMORAL PEG (Knees) ×3 IMPLANT
DRAPE EXTREMITY T 121X128X90 (DRAPE) ×3 IMPLANT
DRAPE HALF SHEET 40X57 (DRAPES) ×6 IMPLANT
DRAPE U-SHAPE 47X51 STRL (DRAPES) ×3 IMPLANT
DRSG MEPILEX BORDER 4X8 (GAUZE/BANDAGES/DRESSINGS) ×3 IMPLANT
DURAPREP 26ML APPLICATOR (WOUND CARE) ×3 IMPLANT
ELECT CAUTERY BLADE 6.4 (BLADE) ×3 IMPLANT
ELECT REM PT RETURN 9FT ADLT (ELECTROSURGICAL) ×3
ELECTRODE REM PT RTRN 9FT ADLT (ELECTROSURGICAL) ×1 IMPLANT
GLOVE BIOGEL PI ORTHO PRO SZ8 (GLOVE) ×4
GLOVE ORTHO TXT STRL SZ7.5 (GLOVE) ×3 IMPLANT
GLOVE PI ORTHO PRO STRL SZ8 (GLOVE) ×2 IMPLANT
GLOVE SURG ORTHO 8.0 STRL STRW (GLOVE) ×3 IMPLANT
GOWN STRL REUS W/ TWL XL LVL3 (GOWN DISPOSABLE) ×1 IMPLANT
GOWN STRL REUS W/TWL 2XL LVL3 (GOWN DISPOSABLE) ×3 IMPLANT
GOWN STRL REUS W/TWL XL LVL3 (GOWN DISPOSABLE) ×2
HANDPIECE INTERPULSE COAX TIP (DISPOSABLE) ×2
HOOD PEEL AWAY FACE SHEILD DIS (HOOD) ×3 IMPLANT
HOOD PEEL AWAY FLYTE STAYCOOL (MISCELLANEOUS) ×3 IMPLANT
IMMOBILIZER KNEE 22 (SOFTGOODS) ×3 IMPLANT
IMMOBILIZER KNEE 22 UNIV (SOFTGOODS) ×3 IMPLANT
KIT BASIN OR (CUSTOM PROCEDURE TRAY) ×3 IMPLANT
KIT TURNOVER KIT B (KITS) ×3 IMPLANT
MANIFOLD NEPTUNE II (INSTRUMENTS) ×3 IMPLANT
NEEDLE 18GX1X1/2 (RX/OR ONLY) (NEEDLE) ×3 IMPLANT
NS IRRIG 1000ML POUR BTL (IV SOLUTION) ×3 IMPLANT
PACK TOTAL JOINT (CUSTOM PROCEDURE TRAY) ×3 IMPLANT
PAD ARMBOARD 7.5X6 YLW CONV (MISCELLANEOUS) ×3 IMPLANT
PATELLA SERIES A (Orthopedic Implant) ×3 IMPLANT
PEG FEMORAL DISTAL (Knees) ×1 IMPLANT
PLATE INTERLOK 6700 (Plate) ×3 IMPLANT
SET HNDPC FAN SPRY TIP SCT (DISPOSABLE) ×1 IMPLANT
SUCTION FRAZIER HANDLE 10FR (MISCELLANEOUS) ×2
SUCTION TUBE FRAZIER 10FR DISP (MISCELLANEOUS) ×1 IMPLANT
SUT VIC AB 0 CT1 27 (SUTURE) ×2
SUT VIC AB 0 CT1 27XBRD ANBCTR (SUTURE) ×1 IMPLANT
SUT VIC AB 2-0 CT1 27 (SUTURE) ×2
SUT VIC AB 2-0 CT1 TAPERPNT 27 (SUTURE) ×1 IMPLANT
SUT VIC AB 3-0 SH 8-18 (SUTURE) ×6 IMPLANT
SYR 30ML LL (SYRINGE) IMPLANT
TIBIAL BEARING VG PS 63/67X10 (Joint) ×3 IMPLANT
TOWEL OR 17X26 10 PK STRL BLUE (TOWEL DISPOSABLE) ×3 IMPLANT
TRAY CATH 16FR W/PLASTIC CATH (SET/KITS/TRAYS/PACK) ×3 IMPLANT

## 2017-06-08 NOTE — Discharge Instructions (Signed)

## 2017-06-08 NOTE — Progress Notes (Signed)
Physical Therapy Treatment Patient Details Name: Emily Zuniga MRN: [id number] DOB: September 20, 1940 Today's Date: 06/08/2017    History of Present Illness Pt admitted for elective L TKA.    PT Comments    Pt is s/p TKA resulting in the deficits listed below (see PT Problem List). Pt tolerated OOB to [facility] then to chair this date with KI on. Anticipate pt to progress well.  Pt will benefit from skilled PT to increase their independence and safety with mobility to allow discharge to the venue listed below.     Follow Up Recommendations  Home health PT;Supervision/Assistance - 24 hour     Equipment Recommendations  None recommended by PT(has recommended equip)    Recommendations for Other Services       Precautions / Restrictions Precautions Precautions: Knee Precaution Booklet Issued: Yes (comment) Precaution Comments: pt with good understanding Restrictions Weight Bearing Restrictions: Yes LLE Weight Bearing: Weight bearing as tolerated    Mobility  Bed Mobility Overal bed mobility: Needs Assistance Bed Mobility: Supine to Sit     Supine to sit: Min assist     General bed mobility comments: minA for L LE management off EOB, HOB elevated and used of bed rail  Transfers Overall transfer level: Needs assistance Equipment used: Rolling walker (2 wheeled) Transfers: Sit to/from [employer] Sit to Stand: Min assist Stand pivot transfers: Min assist       General transfer comment: v/c's to push up from bed, v/c's for sequencing. pt able to advance L LE during std pvt to chair  Ambulation/Gait             General Gait Details: limited to std pvt to [facility] and then to chair   Stairs             Wheelchair Mobility    Modified Rankin (Stroke Patients Only)       Balance Overall balance assessment: Needs assistance Sitting-balance support: No upper extremity supported Sitting balance-Leahy Scale: Good     Standing balance support: Bilateral  upper extremity supported Standing balance-Leahy Scale: Poor Standing balance comment: requires RW for safe standing                            Cognition Arousal/Alertness: Awake/alert Behavior During Therapy: WFL for tasks assessed/performed Overall Cognitive Status: Within Functional Limits for tasks assessed                                        Exercises General Exercises - Lower Extremity Ankle Circles/Pumps: AROM;Both;10 reps;Supine Quad Sets: AROM;Left;10 reps;Supine Heel Slides: AAROM;Left;10 reps    General Comments General comments (skin integrity, edema, etc.): L knee in ace wrap      Pertinent Vitals/Pain Pain Assessment: 0-10 Pain Score: 5  Pain Location: L knee Pain Descriptors / Indicators: Sore Pain Intervention(s): Premedicated before session    Home Living Family/patient expects to be discharged to:: Private residence Living Arrangements: Alone(daughter to be staying with her) Available Help at Discharge: Family;Available 24 hours/day Type of Home: House Home Access: Stairs to enter   Home Layout: One level Home Equipment: Environmental consultant - 2 wheels;Bedside commode      Prior Function Level of Independence: Independent          PT Goals (current goals can now be found in the care plan section) Acute Rehab PT Goals Patient Stated  Goal: get this knee working PT Goal Formulation: With patient Time For Goal Achievement: 06/22/17 Potential to Achieve Goals: Good    Frequency           PT Plan      Co-evaluation              AM-PAC PT "6 Clicks" Daily Activity  Outcome Measure  Difficulty turning over in bed (including adjusting bedclothes, sheets and blankets)?: Unable Difficulty moving from lying on back to sitting on the side of the bed? : Unable Difficulty sitting down on and standing up from a chair with arms (e.g., wheelchair, bedside commode, etc,.)?: Unable Help needed moving to and from a bed to chair  (including a wheelchair)?: A Little Help needed walking in hospital room?: A Little Help needed climbing 3-5 steps with a railing? : A Lot 6 Click Score: 11    End of Session Equipment Utilized During Treatment: Gait belt             Time: 1341-1405 PT Time Calculation (min) (ACUTE ONLY): 24 min  Charges:  $Therapeutic Activity: 8-22 mins                    G Codes:       Lewis Shock, PT, DPT Pager #: [phone] Office #: [phone]    Sayuri Rhames M Rhyder Koegel 06/08/2017, 2:18 PM

## 2017-06-08 NOTE — Op Note (Signed)
DATE OF SURGERY:  06/08/2017 TIME: 9:36 AM  PATIENT NAME:  Emily Zuniga   AGE: 77 y.o.    PRE-OPERATIVE DIAGNOSIS:  Left knee primary localized osteoarthritis  POST-OPERATIVE DIAGNOSIS:  Same  PROCEDURE:  Left total Knee Arthroplasty  SURGEON:  Eulas Post, MD   ASSISTANT:  Janace Litten, OPA-C, present and scrubbed throughout the case, critical for assistance with exposure, retraction, instrumentation, and closure.   OPERATIVE IMPLANTS: [employer]omet Vanguard Fixed Bearing Posterior Stabilized Femur size 65, Tibia size 67, Patella size 31 3-peg oval button, with a 10 mm polyethylene insert.  ANESTHESIA:  Spinal with intrarticular injection of 30ml of marcaine and  of Toradol.  Adductor block as well.  PREOPERATIVE INDICATIONS:  Emily Zuniga is a 77 y.o. year old female with end stage bone on bone degenerative arthritis of the knee who failed conservative treatment, including injections, antiinflammatories, activity modification, and assistive devices, and had significant impairment of their activities of daily living, and elected for Total Knee Arthroplasty.   The risks, benefits, and alternatives were discussed at length including but not limited to the risks of infection, bleeding, nerve injury, stiffness, blood clots, the need for revision surgery, cardiopulmonary complications, among others, and they were willing to proceed.  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  The lateral side was beginning to scallop out. The intramedullary guide was very tight, and did not want to go all the way down and I did not force it.  I suspect her canal was small.    ESTIMATED BLOOD LOSS: 200 ml.  OPERATIVE DESCRIPTION:  The patient was brought to the operative room and placed in a supine position.  Spinal anesthesia was administered.  IV antibiotics were given.  The lower extremity was prepped and  draped in the usual sterile fashion.  Time out was performed.  The leg was elevated and exsanguinated and the tourniquet was inflated.  Anterior quadriceps tendon splitting approach was performed.  The patella was everted and osteophytes were removed.  The anterior horn of the medial and lateral meniscus was removed.   The distal femur was opened with the drill and the intramedullary distal femoral cutting jig was utilized, set at 5 degrees resecting 9 mm off the distal femur.  Care was taken to protect the collateral ligaments.  The intramedullary jig was tight, and I did not force it fully in, but enough to get the line.  Then the extramedullary tibial cutting jig was utilized making the appropriate cut using the anterior tibial crest as a reference building in appropriate posterior slope.  Care was taken during the cut to protect the medial and collateral ligaments.  The proximal tibia was removed along with the posterior horns of the menisci.  The PCL was sacrificed.    The extensor gap was measured and was approximately 10mm.    The distal femoral sizing jig was applied, taking care to avoid notching.  Then the 4-in-1 cutting jig was applied and the anterior and posterior femur was cut, along with the chamfer cuts.  All posterior osteophytes were removed.  The flexion gap was then measured and was symmetric with the extension gap.  I completed the distal femoral preparation using the appropriate jig to prepare the box.  The patella was then measured, and cut with the saw.  The thickness before the cut was 21.5 and after the cut was 14.  The proximal tibia sized  and prepared accordingly with the reamer and the punch, and then all components were trialed with the 10mm poly insert.  The knee was found to have excellent balance and full motion.    The above named components were then cemented into place and all excess cement was removed.  The real polyethylene implant was placed.  After the cement  had cured I released the tourniquet and confirmed excellent hemostasis with no major posterior vessel injury.    The knee was easily taken through a range of motion and the patella tracked well and the knee irrigated copiously and the parapatellar and subcutaneous tissue closed with vicryl, and monocryl with steri strips for the skin.  The wounds were injected with marcaine, and dressed with sterile gauze and the patient was awakened and returned to the PACU in stable and satisfactory condition.  There were no complications.  Total tourniquet time was ~93 minutes.

## 2017-06-08 NOTE — Anesthesia Procedure Notes (Signed)
Procedure Name: MAC Date/Time: 06/08/2017 7:45 AM Performed by: Kyung Rudd, CRNA Pre-anesthesia Checklist: Patient identified, Emergency Drugs available, Suction available and Patient being monitored Patient Re-evaluated:Patient Re-evaluated prior to induction Oxygen Delivery Method: Simple face mask Induction Type: IV induction Placement Confirmation: positive ETCO2 Dental Injury: Teeth and Oropharynx as per pre-operative assessment

## 2017-06-08 NOTE — H&P (Signed)
PREOPERATIVE H&P  Chief Complaint: left knee pain  HPI: Emily Zuniga is a 77 y.o. female who presents for preoperative history and physical with a diagnosis of left knee oa. Symptoms are rated as moderate to severe, and have been worsening.  This is significantly impairing activities of daily living.  She has elected for surgical management.   She has failed injections, activity modification, anti-inflammatories, and assistive devices.  Preoperative X-rays demonstrate end stage degenerative changes with osteophyte formation, loss of joint space, subchondral sclerosis.   Past Medical History:  Diagnosis Date  . Arthritis of left knee    End Stage  . Hypertension    Past Surgical History:  Procedure Laterality Date  . COLONOSCOPY W/ POLYPECTOMY     Social History   Socioeconomic History  . Marital status: Widowed    Spouse name: Not on file  . Number of children: Not on file  . Years of education: Not on file  . Highest education level: Not on file  Occupational History  . Not on file  Social Needs  . Financial resource strain: Not on file  . Food insecurity:    Worry: Not on file    Inability: Not on file  . Transportation needs:    Medical: Not on file    Non-medical: Not on file  Tobacco Use  . Smoking status: Never Smoker  . Smokeless tobacco: Never Used  Substance and Sexual Activity  . Alcohol use: No  . Drug use: No  . Sexual activity: Not on file  Lifestyle  . Physical activity:    Days per week: Not on file    Minutes per session: Not on file  . Stress: Not on file  Relationships  . Social connections:    Talks on phone: Not on file    Gets together: Not on file    Attends religious service: Not on file    Active member of club or organization: Not on file    Attends meetings of clubs or organizations: Not on file    Relationship status: Not on file  Other Topics Concern  . Not on file  Social History Narrative  . Not on file   Family History   Problem Relation Age of Onset  . Hypertension Mother   . Hypertension Father   . Hypertension Daughter    No Known Allergies Prior to Admission medications   Medication Sig Start Date End Date Taking? Authorizing Provider  acetaminophen (TYLENOL) 500 MG tablet Take 1,000 mg by mouth every 8 (eight) hours as needed.   Yes [provider]  amLODipine (NORVASC) 5 MG tablet Take 5 mg by mouth daily.    Yes [provider]  Calcium Carbonate-Vit D-Min (CALCIUM 1200 PO) Take 1 tablet by mouth daily.    Yes [provider]  enalapril-hydrochlorothiazide (VASERETIC) 10-25 MG tablet Take 1 tablet by mouth daily.   Yes [provider]  Multiple Vitamins-Minerals (MULTIVITAMIN WITH MINERALS) tablet Take 1 tablet by mouth daily.   Yes [provider]  naproxen sodium (ALEVE) 220 MG tablet Take 220 mg by mouth 2 (two) times daily as needed.   Yes [provider]     Positive ROS: All other systems have been reviewed and were otherwise negative with the exception of those mentioned in the HPI and as above.  Physical Exam: General: Alert, no acute distress Cardiovascular: No pedal edema Respiratory: No cyanosis, no use of accessory musculature GI: No organomegaly, abdomen is soft and non-tender  Skin: No lesions in the area of chief complaint Neurologic: Sensation intact distally Psychiatric: Patient is competent for consent with normal mood and affect Lymphatic: No axillary or cervical lymphadenopathy  MUSCULOSKELETAL: left knee with crepitance and painful arc of motion  Assessment: Left knee oa  Estimated body mass index is 40.17 kg/m as calculated from the following:   Height as of 05/28/17:  (1.575 m).   Weight as of 05/28/17: 99.6 kg (219 lb 9.6 oz).    Plan: Plan for Procedure(s): TOTAL KNEE ARTHROPLASTY  The risks benefits and alternatives were discussed with the patient including but not limited to the risks of  nonoperative treatment, versus surgical intervention including infection, bleeding, nerve injury,  blood clots, cardiopulmonary complications, morbidity, mortality, among others, and they were willing to proceed.   Anticipated LOS equal to or greater than 2 midnights due to - Age 54 and older with one or more of the following:  - Obesity  - Expected need for hospital services (PT, OT, Nursing) required for safe  discharge  - Anticipated need for postoperative skilled nursing care or inpatient rehab  -  Past Medical History:  Diagnosis Date  . Arthritis of left knee    End Stage  . Hypertension        Preoperative templating of the joint replacement has been completed, documented, and submitted to the Operating Room personnel in order to optimize intra-operative equipment management.  Eulas Post, MD Cell [phone]   06/08/2017 7:26 AM

## 2017-06-08 NOTE — Anesthesia Preprocedure Evaluation (Addendum)
Anesthesia Evaluation  Patient identified by MRN, date of birth, ID band Patient awake    Reviewed: Allergy & Precautions, NPO status , Patient's Chart, lab work & pertinent test results  History of Anesthesia Complications Negative for: history of anesthetic complications  Airway Mallampati: II  TM Distance: >3 FB Neck ROM: Full    Dental no notable dental hx. (+) Teeth Intact, Dental Advisory Given   Pulmonary neg pulmonary ROS,    Pulmonary exam normal breath sounds clear to auscultation       Cardiovascular hypertension, Pt. on medications Normal cardiovascular exam Rhythm:Regular Rate:Normal  ECG: NSR, rate 61   Neuro/Psych negative neurological ROS  negative psych ROS   GI/Hepatic negative GI ROS, Neg liver ROS,   Endo/Other  Morbid obesity  Renal/GU negative Renal ROS     Musculoskeletal   Abdominal   Peds  Hematology   Anesthesia Other Findings   Reproductive/Obstetrics                           Anesthesia Physical Anesthesia Plan  ASA: III  Anesthesia Plan: Spinal   Post-op Pain Management:  Regional for Post-op pain   Induction:   PONV Risk Score and Plan: 2 and Dexamethasone, Ondansetron and Propofol infusion  Airway Management Planned: Natural Airway  Additional Equipment:   Intra-op Plan:   Post-operative Plan:   Informed Consent: I have reviewed the patients History and Physical, chart, labs and discussed the procedure including the risks, benefits and alternatives for the proposed anesthesia with the patient or authorized representative who has indicated his/her understanding and acceptance.     Plan Discussed with: CRNA  Anesthesia Plan Comments:         Anesthesia Quick Evaluation

## 2017-06-08 NOTE — Transfer of Care (Signed)
Immediate Anesthesia Transfer of Care Note  Patient: Emily Zuniga  Procedure(s) Performed: TOTAL KNEE ARTHROPLASTY (Left Knee)  Patient Location: PACU  Anesthesia Type:Spinal  Level of Consciousness: awake, alert  and oriented  Airway & Oxygen Therapy: Patient Spontanous Breathing and Patient connected to nasal cannula oxygen  Post-op Assessment: Report given to RN and Post -op Vital signs reviewed and stable  Post vital signs: Reviewed and stable  Last Vitals:  Vitals Value Taken Time  BP 107/71 06/08/2017 10:35 AM  Temp    Pulse 64 06/08/2017 10:36 AM  Resp 16 06/08/2017 10:36 AM  SpO2 100 % 06/08/2017 10:36 AM  Vitals shown include unvalidated device data.  Last Pain:  Vitals:   06/08/17 0550  TempSrc:   PainSc: 4       Patients Stated Pain Goal: 3 (06/08/17 0550)  Complications: No apparent anesthesia complications

## 2017-06-08 NOTE — Anesthesia Procedure Notes (Signed)
Spinal  Patient location during procedure: OR Start time: 06/08/2017 7:40 AM End time: 06/08/2017 7:50 AM Staffing Anesthesiologist: Leonides Grills, MD Performed: anesthesiologist  Preanesthetic Checklist Completed: patient identified, surgical consent, pre-op evaluation, timeout performed, IV checked, risks and benefits discussed and monitors and equipment checked Spinal Block Patient position: sitting Prep: DuraPrep Patient monitoring: cardiac monitor, continuous pulse ox and blood pressure Approach: midline Location: L4-5 Injection technique: single-shot Needle Needle type: Pencan  Needle gauge: 24 G Needle length: 9 cm Assessment Sensory level: T10 Additional Notes Functioning IV was confirmed and monitors were applied. Sterile prep and drape, including hand hygiene and sterile gloves were used. The patient was positioned and the spine was prepped. The skin was anesthetized with lidocaine.  Free flow of clear CSF was obtained prior to injecting local anesthetic into the CSF.  The spinal needle aspirated freely following injection.  The needle was carefully withdrawn.  The patient tolerated the procedure well.

## 2017-06-08 NOTE — Anesthesia Postprocedure Evaluation (Signed)
Anesthesia Post Note  Patient: Emily Zuniga  Procedure(s) Performed: TOTAL KNEE ARTHROPLASTY (Left Knee)     Patient location during evaluation: PACU Anesthesia Type: Spinal Level of consciousness: awake and alert Pain management: pain level controlled Vital Signs Assessment: post-procedure vital signs reviewed and stable Respiratory status: spontaneous breathing, nonlabored ventilation, respiratory function stable and patient connected to nasal cannula oxygen Cardiovascular status: blood pressure returned to baseline and stable Postop Assessment: no apparent nausea or vomiting Anesthetic complications: no    Last Vitals:  Vitals:   06/08/17 1212 06/08/17 1233  BP: (!) 118/56 135/70  Pulse: (!) 56 (!) 54  Resp:  16  Temp: (!) 36.3 C (!) 36.3 C  SpO2:  (!) 58%    Last Pain:  Vitals:   06/08/17 1233  TempSrc: Oral  PainSc:                  Chianna Spirito,JAMES TERRILL

## 2017-06-08 NOTE — Anesthesia Procedure Notes (Signed)
Anesthesia Regional Block: Adductor canal block   Pre-Anesthetic Checklist: ,, timeout performed, Correct Patient, Correct Site, Correct Laterality, Correct Procedure,, site marked, risks and benefits discussed, Surgical consent,  Pre-op evaluation,  At surgeon's request and post-op pain management  Laterality: Left  Prep: chloraprep       Needles:  Injection technique: Single-shot  Needle Type: Echogenic Stimulator Needle     Needle Length: 10cm  Needle Gauge: 21     Additional Needles:   Procedures:,,,, ultrasound used (permanent image in chart),,,,  Narrative:  Start time: 06/08/2017 7:05 AM End time: 06/08/2017 7:15 AM Injection made incrementally with aspirations every 5 mL.  Performed by: Personally  Anesthesiologist: Leonides Grills, MD  Additional Notes: Functioning IV was confirmed and monitors were applied.  A 21ga [place] echogenic stimulator needle was used. Sterile prep, hand hygiene and sterile gloves were used.  Negative aspiration and negative test dose prior to incremental administration of local anesthetic. The patient tolerated the procedure well.

## 2017-06-09 ENCOUNTER — Encounter (HOSPITAL_COMMUNITY): Payer: Self-pay | Admitting: General Practice

## 2017-06-09 LAB — CBC
HCT: 35 % — ABNORMAL LOW (ref 36.0–46.0)
HEMOGLOBIN: 11.3 g/dL — AB (ref 12.0–15.0)
MCH: 29.5 pg (ref 26.0–34.0)
MCHC: 32.3 g/dL (ref 30.0–36.0)
MCV: 91.4 fL (ref 78.0–100.0)
Platelets: 225 10*3/uL (ref 150–400)
RBC: 3.83 MIL/uL — AB (ref 3.87–5.11)
RDW: 12.3 % (ref 11.5–15.5)
WBC: 11 10*3/uL — ABNORMAL HIGH (ref 4.0–10.5)

## 2017-06-09 LAB — BASIC METABOLIC PANEL
Anion gap: 8 (ref 5–15)
BUN: 18 mg/dL (ref 6–20)
CHLORIDE: 101 mmol/L (ref 101–111)
CO2: 27 mmol/L (ref 22–32)
Calcium: 8.9 mg/dL (ref 8.9–10.3)
Creatinine, Ser: 1.2 mg/dL — ABNORMAL HIGH (ref 0.44–1.00)
GFR calc Af Amer: 50 mL/min — ABNORMAL LOW (ref >60)
GFR calc non Af Amer: 43 mL/min — ABNORMAL LOW (ref >60)
GLUCOSE: 121 mg/dL — AB (ref 65–99)
Potassium: 4 mmol/L (ref 3.5–5.1)
Sodium: 136 mmol/L (ref 135–145)

## 2017-06-09 NOTE — Progress Notes (Signed)
Chaplain visited with PT at request of case manager.  PT is very faithful and even her coming for surgery was an act of obedience and faith.  Chaplain prayed with the PT and had long talk about life and growth in the [place] walk.

## 2017-06-09 NOTE — Care Management Note (Signed)
Case Management Note  Patient Details  Name: Emily Zuniga MRN: [id number] Date of Birth: 05/31/1940  Subjective/Objective:    77 yr old female s/p left total knee arthroplasty.                Action/Plan: Case manager spoke with patient and her daughter concerning discharge plan and DME. Emily Zuniga has RW and 3in1 at home. Patient was preoperatively setup with [facility], they are not in Network with [employer]. CM contacted Rhunette Croft, [facility] Liaison and they are able to provide [facility] therapy. Patient will have family support at discharge.    Expected Discharge Date:     06/10/17             Expected Discharge Plan:  Home w Home Health Services  In-House Referral:  NA  Discharge planning Services  CM Consult  Post Acute Care Choice:  Home Health Choice offered to:  Patient  DME Arranged:  N/A(Has RW and 3in1) DME Agency:  NA  HH Arranged:  PT HH Agency:  [facility]  Status of Service:  Completed, signed off  If discussed at Long Length of Stay Meetings, dates discussed:    Additional Comments:  Emily Guthrie, RN 06/09/2017, 8:47 AM

## 2017-06-09 NOTE — Progress Notes (Signed)
     Subjective:  Patient reports pain as moderate.  Doing ok.  Hasn't walked.    Objective:   VITALS:   Vitals:   06/08/17 1539 06/08/17 1943 06/08/17 2351 06/09/17 0445  BP: 124/70 124/67 (!) 141/81 130/76  Pulse: 62 79 73 (!) 56  Resp: 17 18    Temp: 97.6 F (36.4 C) 98 F (36.7 C) 98.1 F (36.7 C) 98 F (36.7 C)  TempSrc: Oral Oral Oral   SpO2: 98% 99% 95% 99%  Weight: 99.6 kg (219 lb 9.6 oz)     Height:  (1.575 m)       Neurologically intact Dorsiflexion/Plantar flexion intact Incision: dressing C/D/I   Lab Results  Component Value Date   WBC 11.0 (H) 06/09/2017   HGB 11.3 (L) 06/09/2017   HCT 35.0 (L) 06/09/2017   MCV 91.4 06/09/2017   PLT 225 06/09/2017   BMET    Component Value Date/Time   NA 136 06/09/2017 0327   K 4.0 06/09/2017 0327   CL 101 06/09/2017 0327   CO2 27 06/09/2017 0327   GLUCOSE 121 (H) 06/09/2017 0327   BUN 18 06/09/2017 0327   CREATININE 1.20 (H) 06/09/2017 0327   CALCIUM 8.9 06/09/2017 0327   GFRNONAA 43 (L) 06/09/2017 0327   GFRAA 50 (L) 06/09/2017 0327     Assessment/Plan: 1 Day Post-Op   Principal Problem:   Primary localized osteoarthritis of left knee Active Problems:   S/P knee replacement   Advance diet Up with therapy Plan for discharge tomorrow   Anticipated LOS equal to or greater than 2 midnights due to - Age 75 and older with one or more of the following:  - Obesity  - Expected need for hospital services (PT, OT, Nursing) required for safe  discharge  - Anticipated need for postoperative skilled nursing care or inpatient rehab  OR   - Unanticipated findings during/Post Surgery: Slow post-op progression: GI, pain control, mobility  - Patient is a high risk of re-admission due to: None     Eulas Post 06/09/2017, 12:28 PM   Teryl Lucy, MD Cell [phone]

## 2017-06-09 NOTE — Progress Notes (Signed)
Physical Therapy Treatment Patient Details Name: Emily Zuniga MRN: [id number] DOB: December 29, 1940 Today's Date: 06/09/2017    History of Present Illness Pt admitted for elective L TKA.    PT Comments    Pt progressing well with advanced gait distance.  Pt remains limited in knee flexion but tolerated flexion activities with facial grimacing noted.  Plan for stair training in am.     Follow Up Recommendations  Home health PT;Supervision/Assistance - 24 hour     Equipment Recommendations  None recommended by PT(has recommended equipment.  )    Recommendations for Other Services       Precautions / Restrictions Precautions Precautions: Knee Precaution Booklet Issued: Yes (comment) Precaution Comments: pt with good understanding Restrictions Weight Bearing Restrictions: Yes LLE Weight Bearing: Weight bearing as tolerated    Mobility  Bed Mobility               General bed mobility comments: Pt in recliner on arrival  Transfers Overall transfer level: Needs assistance Equipment used: Rolling walker (2 wheeled) Transfers: Sit to/from Stand Sit to Stand: Min assist;Min guard         General transfer comment: VCs for hand placement needed.    Ambulation/Gait Ambulation/Gait assistance: Min guard Ambulation Distance (Feet): 215 Feet Assistive device: Rolling walker (2 wheeled) Gait Pattern/deviations: Step-to pattern;Trunk flexed;Antalgic   Gait velocity interpretation: <1.8 ft/sec, indicate of risk for recurrent falls General Gait Details: Cues for upper trunk control and keeping close proxmity to RW remain.     Stairs             Wheelchair Mobility    Modified Rankin (Stroke Patients Only)       Balance Overall balance assessment: Needs assistance   Sitting balance-Leahy Scale: Good       Standing balance-Leahy Scale: Poor Standing balance comment: requires RW for safe standing                            Cognition  Arousal/Alertness: Awake/alert Behavior During Therapy: WFL for tasks assessed/performed Overall Cognitive Status: Within Functional Limits for tasks assessed                                        Exercises Total Joint Exercises Ankle Circles/Pumps: AROM;Both;10 reps;Supine Quad Sets: AROM;Left;10 reps;Supine Heel Slides: Left;10 reps;Supine;AAROM Hip ABduction/ADduction: AROM;Left;10 reps;Supine Long Arc Quad: AROM;Left;10 reps;Seated    General Comments        Pertinent Vitals/Pain Pain Assessment: 0-10 Pain Score: 7  Pain Location: L knee Pain Descriptors / Indicators: Sore Pain Intervention(s): Monitored during session;Repositioned;Ice applied    Home Living                      Prior Function            PT Goals (current goals can now be found in the care plan section) Acute Rehab PT Goals Patient Stated Goal: get this knee working Potential to Achieve Goals: Good Progress towards PT goals: Progressing toward goals    Frequency           PT Plan Current plan remains appropriate    Co-evaluation              AM-PAC PT "6 Clicks" Daily Activity  Outcome Measure  Difficulty turning over in bed (including adjusting bedclothes, sheets and blankets)?:  A Little Difficulty moving from lying on back to sitting on the side of the bed? : A Little Difficulty sitting down on and standing up from a chair with arms (e.g., wheelchair, bedside commode, etc,.)?: A Little Help needed moving to and from a bed to chair (including a wheelchair)?: A Little Help needed walking in hospital room?: A Little Help needed climbing 3-5 steps with a railing? : A Little 6 Click Score: 18    End of Session Equipment Utilized During Treatment: Gait belt Activity Tolerance: Patient tolerated treatment well Patient left: in chair;with call bell/phone within reach Nurse Communication: Mobility status       Time: 4010-2725 PT Time Calculation (min)  (ACUTE ONLY): 24 min  Charges:  $Gait Training: 8-22 mins $Therapeutic Exercise: 8-22 mins                    G Codes:       Joycelyn Rua, PTA pager [phone]    Florestine Avers 06/09/2017, 5:04 PM

## 2017-06-09 NOTE — Progress Notes (Addendum)
Physical Therapy Treatment Patient Details Name: Emily Zuniga MRN: [id number] DOB: 01/19/1941 Today's Date: 06/09/2017    History of Present Illness Pt admitted for elective L TKA.    PT Comments    Pt performed gait progression and reviewed supine exercise to promote ROM at knee.  Plan for progression of mobility this pm.     Follow Up Recommendations  Home health PT;Supervision/Assistance - 24 hour     Equipment Recommendations  None recommended by PT(has rec equip. )    Recommendations for Other Services       Precautions / Restrictions Precautions Precautions: Knee Precaution Booklet Issued: Yes (comment) Precaution Comments: pt with good understanding Restrictions Weight Bearing Restrictions: Yes LLE Weight Bearing: Weight bearing as tolerated    Mobility  Bed Mobility               General bed mobility comments: Pt in recliner on arrival  Transfers Overall transfer level: Needs assistance Equipment used: Rolling walker (2 wheeled) Transfers: Sit to/from Stand Sit to Stand: Min assist         General transfer comment: v/c's to push up from chair.  Cues for forward weight shifting and assistance to boost into standing. Pt with poor eccentric loading.    Ambulation/Gait Ambulation/Gait assistance: Min assist Ambulation Distance (Feet): 60 Feet Assistive device: Rolling walker (2 wheeled) Gait Pattern/deviations: Step-to pattern;Trunk flexed;Antalgic   Gait velocity interpretation: <1.8 ft/sec, indicate of risk for recurrent falls General Gait Details: Cues for upper trunk control and keeping close proxmity to RW.     Stairs             Wheelchair Mobility    Modified Rankin (Stroke Patients Only)       Balance Overall balance assessment: Needs assistance   Sitting balance-Leahy Scale: Good       Standing balance-Leahy Scale: Poor Standing balance comment: requires RW for safe standing                             Cognition Arousal/Alertness: Awake/alert Behavior During Therapy: WFL for tasks assessed/performed Overall Cognitive Status: Within Functional Limits for tasks assessed                                        Exercises Total Joint Exercises Ankle Circles/Pumps: AROM;Both;10 reps;Supine Quad Sets: AROM;Left;10 reps;Supine Heel Slides: AROM;Left;10 reps;Supine ROM to L knee 55 degrees.     General Comments        Pertinent Vitals/Pain Pain Assessment: 0-10 Pain Score: 6  Pain Location: L knee Pain Descriptors / Indicators: Sore Pain Intervention(s): Monitored during session;Repositioned;Ice applied    Home Living                      Prior Function            PT Goals (current goals can now be found in the care plan section) Acute Rehab PT Goals Patient Stated Goal: get this knee working Potential to Achieve Goals: Good Progress towards PT goals: Progressing toward goals    Frequency           PT Plan Current plan remains appropriate    Co-evaluation              AM-PAC PT "6 Clicks" Daily Activity  Outcome Measure  Difficulty turning over in bed (including  adjusting bedclothes, sheets and blankets)?: Unable Difficulty moving from lying on back to sitting on the side of the bed? : Unable   Help needed moving to and from a bed to chair (including a wheelchair)?: A Little Help needed walking in hospital room?: A Little Help needed climbing 3-5 steps with a railing? : A Little 6 Click Score: 11    End of Session Equipment Utilized During Treatment: Gait belt Activity Tolerance: Patient tolerated treatment well Patient left: in chair;with call bell/phone within reach         Time: 1201-1227 PT Time Calculation (min) (ACUTE ONLY): 26 min  Charges:  $Gait Training: 8-22 mins $Therapeutic Activity: 8-22 mins                    G Codes:       Joycelyn Rua, PTA pager [phone]    Florestine Avers 06/09/2017, 3:53  PM

## 2017-06-10 LAB — CBC
HEMATOCRIT: 34 % — AB (ref 36.0–46.0)
Hemoglobin: 11 g/dL — ABNORMAL LOW (ref 12.0–15.0)
MCH: 29.1 pg (ref 26.0–34.0)
MCHC: 32.4 g/dL (ref 30.0–36.0)
MCV: 89.9 fL (ref 78.0–100.0)
Platelets: 230 10*3/uL (ref 150–400)
RBC: 3.78 MIL/uL — ABNORMAL LOW (ref 3.87–5.11)
RDW: 12.5 % (ref 11.5–15.5)
WBC: 11.8 10*3/uL — ABNORMAL HIGH (ref 4.0–10.5)

## 2017-06-10 NOTE — Progress Notes (Signed)
Rollen Sox to be D/C'd Home per MD order.  Discussed prescriptions and follow up appointments with the patient. Prescriptions given to patient, medication list explained in detail. Pt verbalized understanding.  Allergies as of 06/10/2017   No Known Allergies     Medication List    STOP taking these medications   acetaminophen 500 MG tablet Commonly known as:  TYLENOL     TAKE these medications   amLODipine 5 MG tablet Commonly known as:  NORVASC Take 5 mg by mouth daily.   aspirin EC 325 MG tablet Take 1 tablet (325 mg total) by mouth daily.   baclofen 10 MG tablet Commonly known as:  LIORESAL Take 1 tablet (10 mg total) by mouth 3 (three) times daily. As needed for muscle spasm   CALCIUM 1200 PO Take 1 tablet by mouth daily.   enalapril-hydrochlorothiazide 10-25 MG tablet Commonly known as:  VASERETIC Take 1 tablet by mouth daily.   HYDROcodone-acetaminophen 10-325 MG tablet Commonly known as:  NORCO Take 1 tablet by mouth every 6 (six) hours as needed.   multivitamin with minerals tablet Take 1 tablet by mouth daily.   naproxen sodium 220 MG tablet Commonly known as:  ALEVE Take 220 mg by mouth 2 (two) times daily as needed.   ondansetron 4 MG tablet Commonly known as:  ZOFRAN Take 1 tablet (4 mg total) by mouth every 8 (eight) hours as needed for nausea or vomiting.   sennosides-docusate sodium 8.6-50 MG tablet Commonly known as:  SENOKOT-S Take 2 tablets by mouth daily.       Vitals:   06/10/17 0423 06/10/17 0823  BP: (!) 147/77 135/71  Pulse: (!) 53   Resp:    Temp: 98.2 F (36.8 C)   SpO2: 97%     Skin clean, dry and intact without evidence of skin break down, no evidence of skin tears noted. Left knee wrapped with compression wrap, clean, dry, and intact. Ice applied and TED hose upon discharge. Patient instructed on how to perform dressing change 3-5 days post op, pt verbally understood teaching. IV catheter discontinued intact. Site without  signs and symptoms of complications. Dressing and pressure applied. Pt denies pain at this time. No complaints noted.  An After Visit Summary was printed and given to the patient. Prescriptions were called into pharmacy by MD. Patient escorted via [facility], and D/C home via private auto.  [place] Brodrick Curran RN

## 2017-06-10 NOTE — Progress Notes (Addendum)
Physical Therapy Treatment Patient Details Name: Emily Zuniga MRN: [id number] DOB: Jan 18, 1941 Today's Date: 06/10/2017    History of Present Illness Pt admitted for elective L TKA.    PT Comments    Pt progressing well during session this am.  Stair training performed to ensure safe entry into home.  Pt also educated and performed shower transfer.   Review was performed on HEP.  Pt is safe to d/c from a mobility standpoint post session.    Follow Up Recommendations  Home health PT;Supervision/Assistance - 24 hour     Equipment Recommendations  None recommended by PT(has recommended equipment)    Recommendations for Other Services       Precautions / Restrictions Precautions Precautions: Knee Precaution Booklet Issued: Yes (comment) Precaution Comments: pt with good understanding Restrictions Weight Bearing Restrictions: Yes LLE Weight Bearing: Weight bearing as tolerated    Mobility  Bed Mobility               General bed mobility comments: Pt in recliner on arrival  Transfers Overall transfer level: Needs assistance Equipment used: Rolling walker (2 wheeled) Transfers: Sit to/from Stand Sit to Stand: Supervision         General transfer comment: VCs for hand placement needed.    Ambulation/Gait Ambulation/Gait assistance: Min guard Ambulation Distance (Feet): 250 Feet Assistive device: Rolling walker (2 wheeled) Gait Pattern/deviations: Step-to pattern;Trunk flexed;Antalgic     General Gait Details: Cues for upper trunk control and keeping close proxmity to RW remain.     Stairs Stairs: Yes Stairs assistance: Min guard Stair Management: No rails;Step to pattern;Forwards;With walker Number of Stairs: 2 General stair comments: Cues for RW placement and sequencing.  Pt performed 2 reps for sequencing.  Pt required cues for entering and exiting step in shower.     Wheelchair Mobility    Modified Rankin (Stroke Patients Only)       Balance  Overall balance assessment: Needs assistance   Sitting balance-Leahy Scale: Good       Standing balance-Leahy Scale: Poor Standing balance comment: requires RW for safe standing                            Cognition Arousal/Alertness: Awake/alert Behavior During Therapy: WFL for tasks assessed/performed Overall Cognitive Status: Within Functional Limits for tasks assessed                                        Exercises Total Joint Exercises Ankle Circles/Pumps: AROM;Both;10 reps;Supine Quad Sets: AROM;Left;10 reps;Supine Short Arc Quad: AROM;Left;10 reps;Supine Heel Slides: Left;10 reps;Supine;AAROM Hip ABduction/ADduction: AROM;Left;10 reps;Supine Straight Leg Raises: AROM;Left;10 reps;Supine Long Arc Quad: AROM;Left;10 reps;Seated Goniometric ROM: 80 degrees flexion in L knee seated.     General Comments        Pertinent Vitals/Pain Pain Assessment: 0-10 Pain Score: 5  Pain Location: L knee Pain Descriptors / Indicators: Sore Pain Intervention(s): Monitored during session;Repositioned;Ice applied    Home Living                      Prior Function            PT Goals (current goals can now be found in the care plan section) Acute Rehab PT Goals Patient Stated Goal: get this knee working Potential to Achieve Goals: Good Progress towards PT goals: Progressing toward goals  Frequency           PT Plan Current plan remains appropriate    Co-evaluation              AM-PAC PT "6 Clicks" Daily Activity  Outcome Measure  Difficulty turning over in bed (including adjusting bedclothes, sheets and blankets)?: None Difficulty moving from lying on back to sitting on the side of the bed? : None Difficulty sitting down on and standing up from a chair with arms (e.g., wheelchair, bedside commode, etc,.)?: A Little Help needed moving to and from a bed to chair (including a wheelchair)?: A Little Help needed walking in  hospital room?: A Little Help needed climbing 3-5 steps with a railing? : A Little 6 Click Score: 20    End of Session Equipment Utilized During Treatment: Gait belt Activity Tolerance: Patient tolerated treatment well Patient left: in chair;with call bell/phone within reach Nurse Communication: Mobility status       Time: 0981-1914 PT Time Calculation (min) (ACUTE ONLY): 21 min  Charges:  $Gait Training: 8-22 mins                    G Codes:       Joycelyn Rua, PTA pager [phone]    Florestine Avers 06/10/2017, 3:47 PM

## 2017-06-10 NOTE — Discharge Summary (Signed)
Physician Discharge Summary  Patient ID: Emily Zuniga MRN: [id number] DOB/AGE: [birth year] 77 y.o.  Admit date: 06/08/2017 Discharge date: 06/10/2017  Admission Diagnoses:  Primary localized osteoarthritis of left knee  Discharge Diagnoses:  Principal Problem:   Primary localized osteoarthritis of left knee Active Problems:   S/P knee replacement   Past Medical History:  Diagnosis Date  . Arthritis of left knee    End Stage  . Hypertension   . Primary localized osteoarthritis of left knee 06/08/2017    Surgeries: Procedure(s): TOTAL KNEE ARTHROPLASTY on 06/08/2017   Consultants (if any):   Discharged Condition: Improved  Hospital Course: Emily Zuniga is an 77 y.o. female who was admitted 06/08/2017 with a diagnosis of Primary localized osteoarthritis of left knee and went to the operating room on 06/08/2017 and underwent the above named procedures.    She was given perioperative antibiotics:  Anti-infectives (From admission, onward)   Start     Dose/Rate Route Frequency Ordered Stop   06/08/17 1330  ceFAZolin (ANCEF) IVPB 2g/100 mL premix     2 g 200 mL/hr over 30 Minutes Intravenous Every 6 hours 06/08/17 1240 06/08/17 2147   06/08/17 0600  ceFAZolin (ANCEF) IVPB 2g/100 mL premix     2 g 200 mL/hr over 30 Minutes Intravenous On call to O.R. 06/08/17 0541 06/08/17 0741   06/08/17 0543  ceFAZolin (ANCEF) 2-4 GM/100ML-% IVPB    Note to Pharmacy:  Ray Church   : cabinet override      06/08/17 0543 06/08/17 0741    .  She was given sequential compression devices, early ambulation, and asa for DVT prophylaxis.  She benefited maximally from the hospital stay and there were no complications.    Recent vital signs:  Vitals:   06/09/17 2010 06/10/17 0423  BP: (!) 157/80 (!) 147/77  Pulse: 76 (!) 53  Resp:    Temp: (!) 97.5 F (36.4 C) 98.2 F (36.8 C)  SpO2: 96% 97%    Recent laboratory studies:  Lab Results  Component Value Date   HGB 11.0 (L) 06/10/2017   HGB 11.3 (L) 06/09/2017   HGB 14.3 05/28/2017   Lab Results  Component Value Date   WBC 11.8 (H) 06/10/2017   PLT 230 06/10/2017   No results found for: INR Lab Results  Component Value Date   NA 136 06/09/2017   K 4.0 06/09/2017   CL 101 06/09/2017   CO2 27 06/09/2017   BUN 18 06/09/2017   CREATININE 1.20 (H) 06/09/2017   GLUCOSE 121 (H) 06/09/2017    Discharge Medications:   Allergies as of 06/10/2017   No Known Allergies     Medication List    STOP taking these medications   acetaminophen 500 MG tablet Commonly known as:  TYLENOL     TAKE these medications   amLODipine 5 MG tablet Commonly known as:  NORVASC Take 5 mg by mouth daily.   aspirin EC 325 MG tablet Take 1 tablet (325 mg total) by mouth daily.   baclofen 10 MG tablet Commonly known as:  LIORESAL Take 1 tablet (10 mg total) by mouth 3 (three) times daily. As needed for muscle spasm   CALCIUM 1200 PO Take 1 tablet by mouth daily.   enalapril-hydrochlorothiazide 10-25 MG tablet Commonly known as:  VASERETIC Take 1 tablet by mouth daily.   HYDROcodone-acetaminophen 10-325 MG tablet Commonly known as:  NORCO Take 1 tablet by mouth every 6 (six) hours as needed.   multivitamin with minerals tablet Take  1 tablet by mouth daily.   naproxen sodium 220 MG tablet Commonly known as:  ALEVE Take 220 mg by mouth 2 (two) times daily as needed.   ondansetron 4 MG tablet Commonly known as:  ZOFRAN Take 1 tablet (4 mg total) by mouth every 8 (eight) hours as needed for nausea or vomiting.   sennosides-docusate sodium 8.6-50 MG tablet Commonly known as:  SENOKOT-S Take 2 tablets by mouth daily.       Diagnostic Studies: Dg Knee Left Port  Result Date: 06/08/2017 CLINICAL DATA:  Status post left knee arthroplasty EXAM: PORTABLE LEFT KNEE - 2 VIEW COMPARISON:  None. FINDINGS: Left knee prosthesis is noted in satisfactory position. No acute bony or soft tissue abnormality is seen. IMPRESSION: No acute  abnormality noted following knee replacement. Electronically Signed   By: Alcide Clever M.D.   On: 06/08/2017 11:22    Disposition:     Follow-up Information    Teryl Lucy, MD. Schedule an appointment as soon as possible for a visit in 2 weeks.   Specialty:  Orthopedic Surgery Contact information: [address] [place] [address] [phone]        [facility] Follow up.   Specialty:  Home Health Services Why:  A repsentative from [facility] will contact you to arrange start date and time for your therapy. Contact information: [address] [place] [address] [phone]            Signed: Eulas Post 06/10/2017, 8:15 AM

## 2017-08-11 ENCOUNTER — Ambulatory Visit
Admission: RE | Admit: 2017-08-11 | Discharge: 2017-08-11 | Disposition: A | Discharge status: Home / Self Care | Payer: Medicare HMO | Source: Ambulatory Visit | Attending: Internal Medicine | Admitting: Internal Medicine

## 2017-08-11 ENCOUNTER — Other Ambulatory Visit: Payer: Self-pay | Admitting: Internal Medicine

## 2017-08-11 DIAGNOSIS — M25571 Pain in right ankle and joints of right foot: Secondary | ICD-10-CM

## 2018-07-06 NOTE — Patient Instructions (Signed)
Emily Zuniga     Your procedure is scheduled on: 07-19-2018  [facility]  Report to SHORT STAY at 530  AM   YOU NEED TO HAVE A COVID 19 TEST ON_______ @_______ , THIS TEST MUST BE DONE BEFORE SURGERY, [facility]. ONCE YOUR COVID TEST IS COMPLETED, PLEASE BEGIN THE QUARANTINE INSTRUCTIONS AS OUTLINED IN YOUR HANDOUT.   Call this number if you have problems the morning of surgery [phone]    Remember: Do not eat food or drink liquids :After Midnight. BRUSH YOUR TEETH MORNING OF SURGERY AND RINSE YOUR MOUTH OUT, NO CHEWING GUM CANDY OR MINTS.  NO SOLID FOOD AFTER MIDNIGHT THE NIGHT PRIOR TO SURGERY. NOTHING BY MOUTH EXCEPT CLEAR LIQUIDS UNTIL  430 AM.  PLEASE FINISH ENSURE DRINK PER SURGEON ORDER 3 HOURS PRIOR TO SCHEDULED SURGERY TIME WHICH NEEDS TO BE COMPLETED AT 430 AM.     CLEAR LIQUID DIET   Foods Allowed                                                                     Foods Excluded  Coffee and tea, regular and decaf                             liquids that you cannot  Plain Jell-O in any flavor                                             see through such as: Fruit ices (not with fruit pulp)                                     milk, soups, orange juice  Iced Popsicles                                    All solid food Carbonated beverages, regular and diet                                    Cranberry, grape and apple juices Sports drinks like Gatorade Lightly seasoned clear broth or consume(fat free) Sugar, honey syrup  Sample Menu Breakfast                                Lunch                                     Supper Cranberry juice                    Beef broth  Chicken broth Jell-O                                     Grape juice                           Apple juice Coffee or tea                        Jell-O                                       Popsicle                                                Coffee or tea                        Coffee or tea  _____________________________________________________________________    Take these medicines the morning of surgery with A SIP OF WATER: AMLODIPINE (NORVASC)                                You may not have any metal on your body including hair pins and              piercings  Do not wear jewelry, make-up, lotions, powders or perfumes, deodorant             Do not wear nail polish.  Do not shave  48 hours prior to surgery.                Do not bring valuables to the hospital. [place].  Contacts, dentures or bridgework may not be worn into surgery.  Leave suitcase in the car. After surgery it may be brought to your room.     _____________________________________________________________________             [facility] - Preparing for Surgery Before surgery, you can play an important role.  Because skin is not sterile, your skin needs to be as free of germs as possible.  You can reduce the number of germs on your skin by washing with CHG (chlorahexidine gluconate) soap before surgery.  CHG is an antiseptic cleaner which kills germs and bonds with the skin to continue killing germs even after washing. Please DO NOT use if you have an allergy to CHG or antibacterial soaps.  If your skin becomes reddened/irritated stop using the CHG and inform your nurse when you arrive at Short Stay. Do not shave (including legs and underarms) for at least 48 hours prior to the first CHG shower.  You may shave your face/neck. Please follow these instructions carefully:  1.  Shower with CHG Soap the night before surgery and the  morning of Surgery.  2.  If you choose to wash your hair, wash your hair first as usual with your  normal  shampoo.  3.  After you shampoo, rinse your hair and body thoroughly to remove  the  shampoo.                            4.  Use CHG as you would any other liquid soap.  You can apply chg directly  to the skin and wash                       Gently with a scrungie or clean washcloth.  5.  Apply the CHG Soap to your body ONLY FROM THE NECK DOWN.   Do not use on face/ open                           Wound or open sores. Avoid contact with eyes, ears mouth and genitals (private parts).                       Wash face,  Genitals (private parts) with your normal soap.             6.  Wash thoroughly, paying special attention to the area where your surgery  will be performed.  7.  Thoroughly rinse your body with warm water from the neck down.  8.  DO NOT shower/wash with your normal soap after using and rinsing off  the CHG Soap.                9.  Pat yourself dry with a clean towel.            10.  Wear clean pajamas.            11.  Place clean sheets on your bed the night of your first shower and do not  sleep with pets. Day of Surgery : Do not apply any lotions/deodorants the morning of surgery.  Please wear clean clothes to the hospital/surgery center.  FAILURE TO FOLLOW THESE INSTRUCTIONS MAY RESULT IN THE CANCELLATION OF YOUR SURGERY PATIENT SIGNATURE_________________________________  NURSE SIGNATURE__________________________________  ________________________________________________________________________   Emily MireIncentive Spirometer  An incentive spirometer is a tool that can help keep your lungs clear and active. This tool measures how well you are filling your lungs with each breath. Taking long deep breaths may help reverse or decrease the chance of developing breathing (pulmonary) problems (especially infection) following:  A long period of time when you are unable to move or be active. BEFORE THE PROCEDURE   If the spirometer includes an indicator to show your best effort, your nurse or respiratory therapist will set it to a desired goal.  If possible, sit up straight or lean slightly forward. Try not  to slouch.  Hold the incentive spirometer in an upright position. INSTRUCTIONS FOR USE  1. Sit on the edge of your bed if possible, or sit up as far as you can in bed or on a chair. 2. Hold the incentive spirometer in an upright position. 3. Breathe out normally. 4. Place the mouthpiece in your mouth and seal your lips tightly around it. 5. Breathe in slowly and as deeply as possible, raising the piston or the ball toward the top of the column. 6. Hold your breath for 3-5 seconds or for as long as possible. Allow the piston or ball to fall to the bottom of the column. 7. Remove the mouthpiece from your mouth and breathe out normally. 8. Rest for a few seconds and repeat Steps 1 through 7 at least  10 times every 1-2 hours when you are awake. Take your time and take a few normal breaths between deep breaths. 9. The spirometer may include an indicator to show your best effort. Use the indicator as a goal to work toward during each repetition. 10. After each set of 10 deep breaths, practice coughing to be sure your lungs are clear. If you have an incision (the cut made at the time of surgery), support your incision when coughing by placing a pillow or rolled up towels firmly against it. Once you are able to get out of bed, walk around indoors and cough well. You may stop using the incentive spirometer when instructed by your caregiver.  RISKS AND COMPLICATIONS  Take your time so you do not get dizzy or light-headed.  If you are in pain, you may need to take or ask for pain medication before doing incentive spirometry. It is harder to take a deep breath if you are having pain. AFTER USE  Rest and breathe slowly and easily.  It can be helpful to keep track of a log of your progress. Your caregiver can provide you with a simple table to help with this. If you are using the spirometer at home, follow these instructions: SEEK MEDICAL CARE IF:   You are having difficultly using the  spirometer.  You have trouble using the spirometer as often as instructed.  Your pain medication is not giving enough relief while using the spirometer.  You develop fever of 100.5 F (38.1 C) or higher. SEEK IMMEDIATE MEDICAL CARE IF:   You cough up bloody sputum that had not been present before.  You develop fever of 102 F (38.9 C) or greater.  You develop worsening pain at or near the incision site. MAKE SURE YOU:   Understand these instructions.  Will watch your condition.  Will get help right away if you are not doing well or get worse. Document Released: 05/18/2006 Document Revised: 03/30/2011 Document Reviewed: 07/19/2006 [facility] Patient Information 2014 [employer] [place].   ________________________________________________________________________

## 2018-07-07 ENCOUNTER — Other Ambulatory Visit: Payer: Self-pay

## 2018-07-07 ENCOUNTER — Encounter (HOSPITAL_COMMUNITY)
Admission: RE | Admit: 2018-07-07 | Discharge: 2018-07-07 | Disposition: A | Discharge status: Home / Self Care | Payer: Medicare HMO | Source: Ambulatory Visit | Attending: Orthopedic Surgery | Admitting: Orthopedic Surgery

## 2018-07-07 ENCOUNTER — Encounter (HOSPITAL_COMMUNITY): Payer: Self-pay

## 2018-07-07 DIAGNOSIS — R9431 Abnormal electrocardiogram [ECG] [EKG]: Secondary | ICD-10-CM | POA: Insufficient documentation

## 2018-07-07 DIAGNOSIS — I10 Essential (primary) hypertension: Secondary | ICD-10-CM | POA: Diagnosis not present

## 2018-07-07 DIAGNOSIS — M1711 Unilateral primary osteoarthritis, right knee: Secondary | ICD-10-CM | POA: Insufficient documentation

## 2018-07-07 DIAGNOSIS — Z01818 Encounter for other preprocedural examination: Secondary | ICD-10-CM | POA: Insufficient documentation

## 2018-07-07 LAB — CBC
HCT: 45.7 % (ref 36.0–46.0)
Hemoglobin: 14.3 g/dL (ref 12.0–15.0)
MCH: 29.1 pg (ref 26.0–34.0)
MCHC: 31.3 g/dL (ref 30.0–36.0)
MCV: 93.1 fL (ref 80.0–100.0)
Platelets: 230 10*3/uL (ref 150–400)
RBC: 4.91 MIL/uL (ref 3.87–5.11)
RDW: 12.3 % (ref 11.5–15.5)
WBC: 5.3 10*3/uL (ref 4.0–10.5)
nRBC: 0 % (ref 0.0–0.2)

## 2018-07-07 LAB — BASIC METABOLIC PANEL
Anion gap: 10 (ref 5–15)
BUN: 19 mg/dL (ref 8–23)
CO2: 30 mmol/L (ref 22–32)
Calcium: 9.9 mg/dL (ref 8.9–10.3)
Chloride: 101 mmol/L (ref 98–111)
Creatinine, Ser: 1.02 mg/dL — ABNORMAL HIGH (ref 0.44–1.00)
GFR calc Af Amer: >60 mL/min (ref >60)
GFR calc non Af Amer: 54 mL/min — ABNORMAL LOW (ref >60)
Glucose, Bld: 99 mg/dL (ref 70–99)
Potassium: 4 mmol/L (ref 3.5–5.1)
Sodium: 141 mmol/L (ref 135–145)

## 2018-07-07 LAB — SURGICAL PCR SCREEN
MRSA, PCR: NEGATIVE
Staphylococcus aureus: NEGATIVE

## 2018-07-13 ENCOUNTER — Other Ambulatory Visit: Payer: Self-pay | Admitting: Orthopedic Surgery

## 2018-07-13 NOTE — Care Plan (Signed)
Spoke with patient prior to surgery. She has used [facility], (now [facility]) in the past for HHPT. SHe will discharge to home with family. She has equipment at home. She will transition to [place] after her MD follow up appointment.  MD and patient agreeable with plan. Choice offered.    Ladell Heads, [place]

## 2018-07-15 ENCOUNTER — Other Ambulatory Visit (HOSPITAL_COMMUNITY)
Admission: RE | Admit: 2018-07-15 | Discharge: 2018-07-15 | Disposition: A | Discharge status: Home / Self Care | Payer: Medicare HMO | Source: Ambulatory Visit | Attending: Orthopedic Surgery | Admitting: Orthopedic Surgery

## 2018-07-15 DIAGNOSIS — Z1159 Encounter for screening for other viral diseases: Secondary | ICD-10-CM | POA: Insufficient documentation

## 2018-07-15 LAB — SARS CORONAVIRUS 2 (TAT 6-24 HRS): SARS Coronavirus 2: NEGATIVE

## 2018-07-18 NOTE — Anesthesia Preprocedure Evaluation (Addendum)
Anesthesia Evaluation  Patient identified by MRN, date of birth, ID band Patient awake    Reviewed: Allergy & Precautions, H&P , NPO status , Patient's Chart, lab work & pertinent test results, reviewed documented beta blocker date and time   History of Anesthesia Complications Negative for: history of anesthetic complications  Airway Mallampati: II  TM Distance: >3 FB Neck ROM: full    Dental no notable dental hx. (+) Teeth Intact, Dental Advisory Given   Pulmonary neg pulmonary ROS,    Pulmonary exam normal breath sounds clear to auscultation       Cardiovascular Exercise Tolerance: Good hypertension, Pt. on medications negative cardio ROS Normal cardiovascular exam Rhythm:regular Rate:Normal  ECG: NSR, rate 61   Neuro/Psych negative neurological ROS  negative psych ROS   GI/Hepatic negative GI ROS, Neg liver ROS,   Endo/Other  negative endocrine ROS  Renal/GU negative Renal ROS  negative genitourinary   Musculoskeletal  (+) Arthritis , Osteoarthritis,    Abdominal   Peds  Hematology negative hematology ROS (+)   Anesthesia Other Findings   Reproductive/Obstetrics negative OB ROS                             Anesthesia Physical  Anesthesia Plan  ASA: III  Anesthesia Plan: Spinal   Post-op Pain Management:  [facility] for Post-op pain   Induction:   PONV Risk Score and Plan: 2 and Dexamethasone, Ondansetron and Propofol infusion  Airway Management Planned: Natural Airway and Nasal Cannula  Additional Equipment:   Intra-op Plan:   Post-operative Plan:   Informed Consent: I have reviewed the patients History and Physical, chart, labs and discussed the procedure including the risks, benefits and alternatives for the proposed anesthesia with the patient or authorized representative who has indicated his/her understanding and acceptance.     Dental Advisory Given  Plan  Discussed with: CRNA, Anesthesiologist and Surgeon  Anesthesia Plan Comments: (  )       Anesthesia Quick Evaluation

## 2018-07-19 ENCOUNTER — Encounter (HOSPITAL_COMMUNITY): Payer: Self-pay | Admitting: Anesthesiology

## 2018-07-19 ENCOUNTER — Observation Stay (HOSPITAL_COMMUNITY)
Admission: RE | Admit: 2018-07-19 | Discharge: 2018-07-21 | Disposition: A | Discharge status: Home Health | Payer: Medicare HMO | Attending: Orthopedic Surgery | Admitting: Orthopedic Surgery

## 2018-07-19 ENCOUNTER — Inpatient Hospital Stay (HOSPITAL_COMMUNITY): Payer: Medicare HMO

## 2018-07-19 ENCOUNTER — Encounter (HOSPITAL_COMMUNITY)
Admission: RE | Disposition: A | Discharge status: Home Health | Payer: Self-pay | Source: Home / Self Care | Attending: Orthopedic Surgery

## 2018-07-19 ENCOUNTER — Ambulatory Visit (HOSPITAL_COMMUNITY): Payer: Medicare HMO | Admitting: Anesthesiology

## 2018-07-19 ENCOUNTER — Ambulatory Visit (HOSPITAL_COMMUNITY): Payer: Medicare HMO | Admitting: Physician Assistant

## 2018-07-19 ENCOUNTER — Other Ambulatory Visit: Payer: Self-pay

## 2018-07-19 DIAGNOSIS — Z96652 Presence of left artificial knee joint: Secondary | ICD-10-CM | POA: Diagnosis not present

## 2018-07-19 DIAGNOSIS — Z79899 Other long term (current) drug therapy: Secondary | ICD-10-CM | POA: Insufficient documentation

## 2018-07-19 DIAGNOSIS — M1711 Unilateral primary osteoarthritis, right knee: Principal | ICD-10-CM

## 2018-07-19 DIAGNOSIS — Z96651 Presence of right artificial knee joint: Secondary | ICD-10-CM

## 2018-07-19 DIAGNOSIS — I10 Essential (primary) hypertension: Secondary | ICD-10-CM | POA: Diagnosis not present

## 2018-07-19 DIAGNOSIS — Z791 Long term (current) use of non-steroidal anti-inflammatories (NSAID): Secondary | ICD-10-CM | POA: Insufficient documentation

## 2018-07-19 HISTORY — DX: Unilateral primary osteoarthritis, right knee: M17.11

## 2018-07-19 HISTORY — PX: TOTAL KNEE ARTHROPLASTY: SHX125

## 2018-07-19 SURGERY — ARTHROPLASTY, KNEE, TOTAL
Anesthesia: Spinal | Site: Knee | Laterality: Right

## 2018-07-19 MED ORDER — DEXAMETHASONE SODIUM PHOSPHATE 10 MG/ML IJ SOLN
10.0000 mg | Freq: Once | INTRAMUSCULAR | Status: AC
  Administered 2018-07-20: 10 mg via INTRAVENOUS
  Filled 2018-07-19: qty 1

## 2018-07-19 MED ORDER — ASPIRIN EC 325 MG PO TBEC: 325.0000 mg | DELAYED_RELEASE_TABLET | Freq: Two times a day (BID) | ORAL | 0 refills | Status: DC

## 2018-07-19 MED ORDER — DEXAMETHASONE SODIUM PHOSPHATE 10 MG/ML IJ SOLN
INTRAMUSCULAR | Status: AC
  Filled 2018-07-19: qty 1

## 2018-07-19 MED ORDER — CALCIUM CARBONATE-VITAMIN D 500-200 MG-UNIT PO TABS
2.0000 | ORAL_TABLET | Freq: Every day | ORAL | Status: DC
  Administered 2018-07-20 – 2018-07-21 (×2): 2 via ORAL
  Filled 2018-07-19 (×2): qty 2

## 2018-07-19 MED ORDER — HYDROCODONE-ACETAMINOPHEN 5-325 MG PO TABS
1.0000 | ORAL_TABLET | ORAL | Status: DC | PRN
  Administered 2018-07-19: 2 via ORAL
  Administered 2018-07-19: 1 via ORAL
  Administered 2018-07-20 – 2018-07-21 (×5): 2 via ORAL
  Filled 2018-07-19 (×7): qty 2
  Filled 2018-07-19: qty 1

## 2018-07-19 MED ORDER — ADULT MULTIVITAMIN W/MINERALS CH
1.0000 | ORAL_TABLET | Freq: Every day | ORAL | Status: DC
  Filled 2018-07-19: qty 1

## 2018-07-19 MED ORDER — MORPHINE SULFATE (PF) 2 MG/ML IV SOLN: 0.5000 mg | INTRAVENOUS | Status: DC | PRN

## 2018-07-19 MED ORDER — ONDANSETRON HCL 4 MG/2ML IJ SOLN
INTRAMUSCULAR | Status: AC
  Filled 2018-07-19: qty 2

## 2018-07-19 MED ORDER — DEXAMETHASONE SODIUM PHOSPHATE 10 MG/ML IJ SOLN
INTRAMUSCULAR | Status: DC | PRN
  Administered 2018-07-19: 10 mg via INTRAVENOUS

## 2018-07-19 MED ORDER — MEPERIDINE HCL 50 MG/ML IJ SOLN: 6.2500 mg | INTRAMUSCULAR | Status: DC | PRN

## 2018-07-19 MED ORDER — TRANEXAMIC ACID-NACL 1000-0.7 MG/100ML-% IV SOLN
1000.0000 mg | INTRAVENOUS | Status: AC
  Administered 2018-07-19: 08:00:00 1000 mg via INTRAVENOUS
  Filled 2018-07-19: qty 100

## 2018-07-19 MED ORDER — WATER FOR IRRIGATION, STERILE IR SOLN
Status: DC | PRN
  Administered 2018-07-19: 2000 mL

## 2018-07-19 MED ORDER — MIDAZOLAM HCL 5 MG/5ML IJ SOLN
INTRAMUSCULAR | Status: DC | PRN
  Administered 2018-07-19: 2 mg via INTRAVENOUS

## 2018-07-19 MED ORDER — ONDANSETRON HCL 4 MG/2ML IJ SOLN: 4.0000 mg | Freq: Once | INTRAMUSCULAR | Status: DC | PRN

## 2018-07-19 MED ORDER — KETOROLAC TROMETHAMINE 30 MG/ML IJ SOLN
INTRAMUSCULAR | Status: DC | PRN
  Administered 2018-07-19: 30 mg via INTRAVENOUS

## 2018-07-19 MED ORDER — ASPIRIN EC 325 MG PO TBEC
325.0000 mg | DELAYED_RELEASE_TABLET | Freq: Two times a day (BID) | ORAL | Status: DC
  Administered 2018-07-19 – 2018-07-21 (×4): 325 mg via ORAL
  Filled 2018-07-19 (×4): qty 1

## 2018-07-19 MED ORDER — HYDROCODONE-ACETAMINOPHEN 7.5-325 MG PO TABS: 1.0000 | ORAL_TABLET | ORAL | Status: DC | PRN

## 2018-07-19 MED ORDER — OXYCODONE HCL 5 MG/5ML PO SOLN: 5.0000 mg | Freq: Once | ORAL | Status: DC | PRN

## 2018-07-19 MED ORDER — ONDANSETRON HCL 4 MG/2ML IJ SOLN
INTRAMUSCULAR | Status: DC | PRN
  Administered 2018-07-19: 4 mg via INTRAVENOUS

## 2018-07-19 MED ORDER — CEFAZOLIN SODIUM-DEXTROSE 2-4 GM/100ML-% IV SOLN
2.0000 g | Freq: Four times a day (QID) | INTRAVENOUS | Status: AC
  Administered 2018-07-19 (×2): 2 g via INTRAVENOUS
  Filled 2018-07-19 (×3): qty 100

## 2018-07-19 MED ORDER — METOCLOPRAMIDE HCL 5 MG/ML IJ SOLN: 5.0000 mg | Freq: Three times a day (TID) | INTRAMUSCULAR | Status: DC | PRN

## 2018-07-19 MED ORDER — PROPOFOL 10 MG/ML IV BOLUS
INTRAVENOUS | Status: AC
  Filled 2018-07-19: qty 60

## 2018-07-19 MED ORDER — ACETAMINOPHEN 500 MG PO TABS
500.0000 mg | ORAL_TABLET | Freq: Four times a day (QID) | ORAL | Status: AC
  Administered 2018-07-19 (×2): 500 mg via ORAL
  Filled 2018-07-19 (×3): qty 1

## 2018-07-19 MED ORDER — ONDANSETRON HCL 4 MG/2ML IJ SOLN: 4.0000 mg | Freq: Four times a day (QID) | INTRAMUSCULAR | Status: DC | PRN

## 2018-07-19 MED ORDER — HYDROCHLOROTHIAZIDE 25 MG PO TABS
25.0000 mg | ORAL_TABLET | Freq: Every day | ORAL | Status: DC
  Administered 2018-07-19 – 2018-07-21 (×3): 25 mg via ORAL
  Filled 2018-07-19 (×3): qty 1

## 2018-07-19 MED ORDER — KETOROLAC TROMETHAMINE 15 MG/ML IJ SOLN
7.5000 mg | Freq: Four times a day (QID) | INTRAMUSCULAR | Status: AC
  Administered 2018-07-19 – 2018-07-20 (×4): 7.5 mg via INTRAVENOUS
  Filled 2018-07-19 (×4): qty 1

## 2018-07-19 MED ORDER — HYDROCODONE-ACETAMINOPHEN 10-325 MG PO TABS: 1.0000 | ORAL_TABLET | Freq: Four times a day (QID) | ORAL | 0 refills | Status: DC | PRN

## 2018-07-19 MED ORDER — SODIUM CHLORIDE 0.9 % IR SOLN
Status: DC | PRN
  Administered 2018-07-19: 1000 mL

## 2018-07-19 MED ORDER — CALCIUM 1200 1200-1000 MG-UNIT PO CHEW: CHEWABLE_TABLET | Freq: Every day | ORAL | Status: DC

## 2018-07-19 MED ORDER — MENTHOL 3 MG MT LOZG: 1.0000 | LOZENGE | OROMUCOSAL | Status: DC | PRN

## 2018-07-19 MED ORDER — ASPIRIN EC 325 MG PO TBEC: 325.0000 mg | DELAYED_RELEASE_TABLET | Freq: Two times a day (BID) | ORAL | Status: DC

## 2018-07-19 MED ORDER — POTASSIUM CHLORIDE IN NACL 20-0.45 MEQ/L-% IV SOLN
INTRAVENOUS | Status: DC
  Administered 2018-07-19 (×2): via INTRAVENOUS
  Filled 2018-07-19 (×3): qty 1000

## 2018-07-19 MED ORDER — CHLORHEXIDINE GLUCONATE 4 % EX LIQD: 60.0000 mL | Freq: Once | CUTANEOUS | Status: DC

## 2018-07-19 MED ORDER — METHOCARBAMOL 500 MG PO TABS
500.0000 mg | ORAL_TABLET | Freq: Four times a day (QID) | ORAL | Status: DC | PRN
  Administered 2018-07-19 – 2018-07-21 (×3): 500 mg via ORAL
  Filled 2018-07-19 (×4): qty 1

## 2018-07-19 MED ORDER — CEFAZOLIN SODIUM-DEXTROSE 2-4 GM/100ML-% IV SOLN
2.0000 g | INTRAVENOUS | Status: AC
  Administered 2018-07-19: 2 g via INTRAVENOUS
  Filled 2018-07-19: qty 100

## 2018-07-19 MED ORDER — FENTANYL CITRATE (PF) 100 MCG/2ML IJ SOLN
INTRAMUSCULAR | Status: DC | PRN
  Administered 2018-07-19 (×2): 100 ug via INTRAVENOUS

## 2018-07-19 MED ORDER — SENNA-DOCUSATE SODIUM 8.6-50 MG PO TABS: 2.0000 | ORAL_TABLET | Freq: Every day | ORAL | 1 refills | Status: DC

## 2018-07-19 MED ORDER — DOCUSATE SODIUM 100 MG PO CAPS
100.0000 mg | ORAL_CAPSULE | Freq: Two times a day (BID) | ORAL | Status: DC
  Administered 2018-07-19 – 2018-07-21 (×4): 100 mg via ORAL
  Filled 2018-07-19 (×4): qty 1

## 2018-07-19 MED ORDER — FENTANYL CITRATE (PF) 100 MCG/2ML IJ SOLN: 25.0000 ug | INTRAMUSCULAR | Status: DC | PRN

## 2018-07-19 MED ORDER — PROPOFOL 10 MG/ML IV BOLUS
INTRAVENOUS | Status: AC
  Filled 2018-07-19: qty 20

## 2018-07-19 MED ORDER — ACETAMINOPHEN 325 MG PO TABS
325.0000 mg | ORAL_TABLET | Freq: Four times a day (QID) | ORAL | Status: DC | PRN
  Administered 2018-07-21: 650 mg via ORAL
  Filled 2018-07-19: qty 2

## 2018-07-19 MED ORDER — LACTATED RINGERS IV SOLN
INTRAVENOUS | Status: DC
  Administered 2018-07-19 (×2): via INTRAVENOUS

## 2018-07-19 MED ORDER — POLYETHYLENE GLYCOL 3350 17 G PO PACK: 17.0000 g | PACK | Freq: Every day | ORAL | Status: DC | PRN

## 2018-07-19 MED ORDER — ACETAMINOPHEN 325 MG PO TABS: 325.0000 mg | ORAL_TABLET | ORAL | Status: DC | PRN

## 2018-07-19 MED ORDER — PHENOL 1.4 % MT LIQD
1.0000 | OROMUCOSAL | Status: DC | PRN
  Filled 2018-07-19: qty 177

## 2018-07-19 MED ORDER — TRANEXAMIC ACID-NACL 1000-0.7 MG/100ML-% IV SOLN
1000.0000 mg | Freq: Once | INTRAVENOUS | Status: AC
  Administered 2018-07-19: 1000 mg via INTRAVENOUS
  Filled 2018-07-19: qty 100

## 2018-07-19 MED ORDER — MAGNESIUM CITRATE PO SOLN: 1.0000 | Freq: Once | ORAL | Status: DC | PRN

## 2018-07-19 MED ORDER — OXYCODONE HCL 5 MG PO TABS: 5.0000 mg | ORAL_TABLET | Freq: Once | ORAL | Status: DC | PRN

## 2018-07-19 MED ORDER — METHOCARBAMOL 500 MG IVPB - SIMPLE MED
500.0000 mg | Freq: Four times a day (QID) | INTRAVENOUS | Status: DC | PRN
  Filled 2018-07-19: qty 50

## 2018-07-19 MED ORDER — ACETAMINOPHEN 500 MG PO TABS
1000.0000 mg | ORAL_TABLET | Freq: Once | ORAL | Status: AC
  Administered 2018-07-19: 1000 mg via ORAL
  Filled 2018-07-19: qty 2

## 2018-07-19 MED ORDER — BUPIVACAINE IN DEXTROSE 0.75-8.25 % IT SOLN
INTRATHECAL | Status: DC | PRN
  Administered 2018-07-19: 1.6 mL via INTRATHECAL

## 2018-07-19 MED ORDER — ADULT MULTIVITAMIN W/MINERALS CH
1.0000 | ORAL_TABLET | Freq: Every day | ORAL | Status: DC
  Administered 2018-07-19 – 2018-07-21 (×3): 1 via ORAL
  Filled 2018-07-19 (×3): qty 1

## 2018-07-19 MED ORDER — METOCLOPRAMIDE HCL 5 MG PO TABS: 5.0000 mg | ORAL_TABLET | Freq: Three times a day (TID) | ORAL | Status: DC | PRN

## 2018-07-19 MED ORDER — PROPOFOL 10 MG/ML IV BOLUS
INTRAVENOUS | Status: DC | PRN
  Administered 2018-07-19: 10 mg via INTRAVENOUS
  Administered 2018-07-19 (×4): 20 mg via INTRAVENOUS

## 2018-07-19 MED ORDER — ROPIVACAINE HCL 7.5 MG/ML IJ SOLN
INTRAMUSCULAR | Status: DC | PRN
  Administered 2018-07-19: 20 mL via PERINEURAL

## 2018-07-19 MED ORDER — POVIDONE-IODINE 10 % EX SWAB
2.0000 "application " | Freq: Once | CUTANEOUS | Status: AC
  Administered 2018-07-19: 2 via TOPICAL

## 2018-07-19 MED ORDER — BACLOFEN 10 MG PO TABS: 10.0000 mg | ORAL_TABLET | Freq: Three times a day (TID) | ORAL | 0 refills | Status: DC

## 2018-07-19 MED ORDER — PROPOFOL 500 MG/50ML IV EMUL
INTRAVENOUS | Status: DC | PRN
  Administered 2018-07-19: 75 ug/kg/min via INTRAVENOUS

## 2018-07-19 MED ORDER — SODIUM CHLORIDE (PF) 0.9 % IJ SOLN
INTRAMUSCULAR | Status: AC
  Filled 2018-07-19: qty 10

## 2018-07-19 MED ORDER — ACETAMINOPHEN 160 MG/5ML PO SOLN: 325.0000 mg | ORAL | Status: DC | PRN

## 2018-07-19 MED ORDER — ZOLPIDEM TARTRATE 5 MG PO TABS: 5.0000 mg | ORAL_TABLET | Freq: Every evening | ORAL | Status: DC | PRN

## 2018-07-19 MED ORDER — BUPIVACAINE HCL (PF) 0.25 % IJ SOLN
INTRAMUSCULAR | Status: AC
  Filled 2018-07-19: qty 30

## 2018-07-19 MED ORDER — BISACODYL 10 MG RE SUPP: 10.0000 mg | Freq: Every day | RECTAL | Status: DC | PRN

## 2018-07-19 MED ORDER — ENALAPRIL MALEATE 10 MG PO TABS
10.0000 mg | ORAL_TABLET | Freq: Every day | ORAL | Status: DC
  Administered 2018-07-20 – 2018-07-21 (×2): 10 mg via ORAL
  Filled 2018-07-19 (×3): qty 1

## 2018-07-19 MED ORDER — ALUM & MAG HYDROXIDE-SIMETH 200-200-20 MG/5ML PO SUSP: 30.0000 mL | ORAL | Status: DC | PRN

## 2018-07-19 MED ORDER — LIDOCAINE 2% (20 MG/ML) 5 ML SYRINGE
INTRAMUSCULAR | Status: AC
  Filled 2018-07-19: qty 5

## 2018-07-19 MED ORDER — ONDANSETRON HCL 4 MG PO TABS: 4.0000 mg | ORAL_TABLET | Freq: Four times a day (QID) | ORAL | Status: DC | PRN

## 2018-07-19 MED ORDER — MIDAZOLAM HCL 2 MG/2ML IJ SOLN
INTRAMUSCULAR | Status: AC
  Filled 2018-07-19: qty 2

## 2018-07-19 MED ORDER — KETOROLAC TROMETHAMINE 30 MG/ML IJ SOLN
INTRAMUSCULAR | Status: AC
  Filled 2018-07-19: qty 1

## 2018-07-19 MED ORDER — DIPHENHYDRAMINE HCL 12.5 MG/5ML PO ELIX: 12.5000 mg | ORAL_SOLUTION | ORAL | Status: DC | PRN

## 2018-07-19 MED ORDER — AMLODIPINE BESYLATE 5 MG PO TABS
5.0000 mg | ORAL_TABLET | Freq: Every day | ORAL | Status: DC
  Administered 2018-07-20 – 2018-07-21 (×2): 5 mg via ORAL
  Filled 2018-07-19 (×2): qty 1

## 2018-07-19 MED ORDER — BUPIVACAINE HCL 0.25 % IJ SOLN
INTRAMUSCULAR | Status: DC | PRN
  Administered 2018-07-19: 30 mL

## 2018-07-19 MED ORDER — FENTANYL CITRATE (PF) 100 MCG/2ML IJ SOLN
INTRAMUSCULAR | Status: AC
  Filled 2018-07-19: qty 2

## 2018-07-19 SURGICAL SUPPLY — 60 items
BAG ZIPLOCK 12X15 (MISCELLANEOUS) IMPLANT
BEARING TIBIA PS 63/67X10 KNEE (Miscellaneous) IMPLANT
BLADE SAG 18X100X1.27 (BLADE) ×6 IMPLANT
BLADE SURG 15 STRL LF DISP TIS (BLADE) ×1 IMPLANT
BLADE SURG 15 STRL SS (BLADE) ×2
BLADE SURG SZ10 CARB STEEL (BLADE) ×6 IMPLANT
BNDG ELASTIC 6X10 VLCR STRL LF (GAUZE/BANDAGES/DRESSINGS) ×2 IMPLANT
BNDG ELASTIC 6X15 VLCR STRL LF (GAUZE/BANDAGES/DRESSINGS) ×3 IMPLANT
BOWL SMART MIX CTS (DISPOSABLE) ×3 IMPLANT
CEMENT BONE R 1X40 (Cement) ×6 IMPLANT
CLOSURE STERI-STRIP 1/2X4 (GAUZE/BANDAGES/DRESSINGS) ×1
CLSR STERI-STRIP ANTIMIC 1/2X4 (GAUZE/BANDAGES/DRESSINGS) ×3 IMPLANT
COMP FEMORAL VANGUARD 65MM (Knees) ×3 IMPLANT
COMPONENT FEMRL VANGUARD 65MM (Knees) IMPLANT
COVER SURGICAL LIGHT HANDLE (MISCELLANEOUS) ×3 IMPLANT
COVER WAND RF STERILE (DRAPES) IMPLANT
CUFF TOURN SGL QUICK 34 (TOURNIQUET CUFF) ×2
CUFF TRNQT CYL 34X4.125X (TOURNIQUET CUFF) ×1 IMPLANT
DECANTER SPIKE VIAL GLASS SM (MISCELLANEOUS) ×2 IMPLANT
DISTAL FEMORAL PEG (Knees) ×3 IMPLANT
DRAPE INCISE IOBAN 66X45 STRL (DRAPES) ×2 IMPLANT
DRAPE U-SHAPE 47X51 STRL (DRAPES) ×3 IMPLANT
DRSG MEPILEX BORDER 4X8 (GAUZE/BANDAGES/DRESSINGS) ×3 IMPLANT
DRSG PAD ABDOMINAL 8X10 ST (GAUZE/BANDAGES/DRESSINGS) ×3 IMPLANT
DURAPREP 26ML APPLICATOR (WOUND CARE) ×6 IMPLANT
ELECT REM PT RETURN 15FT ADLT (MISCELLANEOUS) ×3 IMPLANT
GLOVE BIO SURGEON STRL SZ7.5 (GLOVE) ×3 IMPLANT
GLOVE BIO SURGEON STRL SZ8 (GLOVE) ×3 IMPLANT
GLOVE BIOGEL PI IND STRL 8 (GLOVE) ×2 IMPLANT
GLOVE BIOGEL PI INDICATOR 8 (GLOVE) ×4
GOWN STRL REUS W/TWL 2XL LVL3 (GOWN DISPOSABLE) ×3 IMPLANT
GOWN STRL REUS W/TWL LRG LVL3 (GOWN DISPOSABLE) ×3 IMPLANT
HANDPIECE INTERPULSE COAX TIP (DISPOSABLE) ×2
HOLDER FOLEY CATH W/STRAP (MISCELLANEOUS) ×2 IMPLANT
HOOD PEEL AWAY FLYTE STAYCOOL (MISCELLANEOUS) ×9 IMPLANT
IMMOBILIZER KNEE 20 (SOFTGOODS) ×3
IMMOBILIZER KNEE 20 THIGH 36 (SOFTGOODS) ×1 IMPLANT
KIT TURNOVER KIT A (KITS) IMPLANT
MANIFOLD NEPTUNE II (INSTRUMENTS) ×3 IMPLANT
NDL SAFETY ECLIPSE 18X1.5 (NEEDLE) IMPLANT
NEEDLE HYPO 18GX1.5 SHARP (NEEDLE) ×2
NS IRRIG 1000ML POUR BTL (IV SOLUTION) ×3 IMPLANT
PACK ICE MAXI GEL EZY WRAP (MISCELLANEOUS) ×3 IMPLANT
PACK TOTAL KNEE CUSTOM (KITS) ×3 IMPLANT
PATELLA SERIES A (Orthopedic Implant) ×2 IMPLANT
PEG FEMORAL DISTAL (Knees) IMPLANT
PLATE INTERLOK 6700 (Plate) ×2 IMPLANT
PROTECTOR NERVE ULNAR (MISCELLANEOUS) ×3 IMPLANT
SET HNDPC FAN SPRY TIP SCT (DISPOSABLE) ×1 IMPLANT
SUT VIC AB 0 CT1 36 (SUTURE) ×3 IMPLANT
SUT VIC AB 2-0 CT1 27 (SUTURE) ×2
SUT VIC AB 2-0 CT1 TAPERPNT 27 (SUTURE) ×1 IMPLANT
SUT VIC AB 2-0 CT2 27 (SUTURE) ×2 IMPLANT
SUT VIC AB 3-0 SH 8-18 (SUTURE) ×3 IMPLANT
SYR 3ML LL SCALE MARK (SYRINGE) ×2 IMPLANT
TIBIA BEAR PS 63/67X10 KNEE (Miscellaneous) ×3 IMPLANT
TRAY FOLEY MTR SLVR 14FR STAT (SET/KITS/TRAYS/PACK) ×2 IMPLANT
TRAY FOLEY MTR SLVR 16FR STAT (SET/KITS/TRAYS/PACK) ×1 IMPLANT
WATER STERILE IRR 1000ML POUR (IV SOLUTION) ×6 IMPLANT
WRAP KNEE MAXI GEL POST OP (GAUZE/BANDAGES/DRESSINGS) ×2 IMPLANT

## 2018-07-19 NOTE — Care Plan (Signed)
Ortho Bundle Case Management Note  Patient Details  Name: Emily Zuniga MRN: [id number] Date of Birth: [birth year]   Spoke with patient prior to surgery. She has used [facility], (now [facility] in the past for HHPT. They are not able to take new patients at this time. She is agreeable to [facility] She will discharge to home with family. She has equipment at home. She will transition to [place] after her MD follow up appointment.  MD and patient agreeable with plan. Choice offered.                   DME Arranged:    DME Agency:     HH Arranged:  PT HH Agency:  [facility]  Additional Comments: Please contact me with any questions of if this plan should need to change.  Ladell Heads,  [place] Specialist  [phone] 07/19/2018, 6:50 AM

## 2018-07-19 NOTE — Op Note (Signed)
DATE OF SURGERY:  07/19/2018 TIME: 9:25 AM  PATIENT NAME:  Emily Zuniga   AGE: 78 y.o.    PRE-OPERATIVE DIAGNOSIS:  Right knee primary localized osteoarthritis  POST-OPERATIVE DIAGNOSIS:  Same  PROCEDURE:  RIGHT Total Knee Arthroplasty  SURGEON:  Johnny Bridge, MD   ASSISTANT:  Joya Gaskins, OPA-C, present and scrubbed throughout the case, critical for assistance with exposure, retraction, instrumentation, and closure.   OPERATIVE IMPLANTS: Biomet Vanguard Fixed Bearing Posterior Stabilized Femur size 65, Tibia size 67, Patella size 31 3-peg oval button, with a 10 mm polyethylene insert.   PREOPERATIVE INDICATIONS:  Emily Zuniga is a 78 y.o. year old female with end stage bone on bone degenerative arthritis of the knee who failed conservative treatment, including injections, antiinflammatories, activity modification, and assistive devices, and had significant impairment of their activities of daily living, and elected for Total Knee Arthroplasty.   The risks, benefits, and alternatives were discussed at length including but not limited to the risks of infection, bleeding, nerve injury, stiffness, blood clots, the need for revision surgery, cardiopulmonary complications, among others, and they were willing to proceed.  OPERATIVE FINDINGS AND UNIQUE ASPECTS OF THE CASE:  The knee was tight and very sclerotic.  I cut the tibia and the femur twice.  The femoral condylar component had a tight fit and it was challenging to pass the femoral condyle laterally.  The cruciate ligaments were ossified.  ESTIMATED BLOOD LOSS: 150 ml  OPERATIVE DESCRIPTION:  The patient was brought to the operative room and placed in a supine position.  Spinal Anesthesia was administered.  IV antibiotics were given.  The lower extremity was prepped and draped in the usual sterile fashion.  Time out was performed.  The leg was elevated and exsanguinated and the tourniquet was inflated.  Anterior quadriceps tendon  splitting approach was performed.  The patella was subluxated and osteophytes were removed.  The anterior horn of the medial and lateral meniscus was removed.   The distal femur was opened with the drill and the intramedullary distal femoral cutting jig was utilized, set at 5 degrees resecting 9 mm off the distal femur.  Care was taken to protect the collateral ligaments.  Then the extramedullary tibial cutting jig was utilized making the appropriate cut using the anterior tibial crest as a reference building in appropriate posterior slope.  Care was taken during the cut to protect the medial and collateral ligaments.  The proximal tibia was removed along with the posterior horns of the menisci.  The PCL was sacrificed.    The extensor gap was measured and was approximately 6 mm and I went back and recut the tibia and femur.  I then had a 80mm gap.    The distal femoral sizing jig was applied, taking care to avoid notching.  Then the 4-in-1 cutting jig was applied and the anterior and posterior femur was cut, along with the chamfer cuts.  All posterior osteophytes were removed.  The flexion gap was then measured and was symmetric with the extension gap.  I completed the distal femoral preparation using the appropriate jig to prepare the box.  The patella was then measured, and cut with the saw.  The thickness before the cut was 22 and after the cut was 14.  The proximal tibia sized and prepared accordingly with the reamer and the punch, and then all components were trialed with the 30mm poly insert.  The knee was found to have excellent balance and full motion.  The above named components were then cemented into place and all excess cement was removed.  The real polyethylene implant was placed.  After the cement had cured I released the tourniquet and confirmed excellent hemostasis with no major posterior vessel injury.    The knee was easily taken through a range of motion and the patella  tracked well and the knee irrigated copiously and the parapatellar and subcutaneous tissue closed with vicryl, and monocryl with steri strips for the skin.  The wounds were injected with marcaine, and dressed with sterile gauze and the patient was awakened and returned to the PACU in stable and satisfactory condition.  There were no complications.  Total tourniquet time was 85 minutes.

## 2018-07-19 NOTE — H&P (Signed)
PREOPERATIVE H&P  Chief Complaint: right knee pain  HPI: Emily Zuniga is a 78 y.o. female who presents for preoperative history and physical with a diagnosis of right knee oa. Symptoms are rated as moderate to severe, and have been worsening.  This is significantly impairing activities of daily living.  She has elected for surgical management.   She has failed injections, activity modification, anti-inflammatories, and assistive devices.  Preoperative X-rays demonstrate end stage degenerative changes with osteophyte formation, loss of joint space, subchondral sclerosis.  She had the other side done in may 2019 and did great. Past Medical History:  Diagnosis Date  . Arthritis of left knee    End Stage  . Hypertension   . Primary localized osteoarthritis of left knee 06/08/2017   Past Surgical History:  Procedure Laterality Date  . COLONOSCOPY W/ POLYPECTOMY    . TOTAL KNEE ARTHROPLASTY Left 06/08/2017  . TOTAL KNEE ARTHROPLASTY Left 06/08/2017   Procedure: TOTAL KNEE ARTHROPLASTY;  Surgeon: Marchia Bond, MD;  Location: [facility];  Service: Orthopedics;  Laterality: Left;   Social History   Socioeconomic History  . Marital status: Widowed    Spouse name: Not on file  . Number of children: Not on file  . Years of education: Not on file  . Highest education level: Not on file  Occupational History  . Not on file  Social Needs  . Financial resource strain: Not on file  . Food insecurity    Worry: Not on file    Inability: Not on file  . Transportation needs    Medical: Not on file    Non-medical: Not on file  Tobacco Use  . Smoking status: Never Smoker  . Smokeless tobacco: Never Used  Substance and Sexual Activity  . Alcohol use: No  . Drug use: No  . Sexual activity: Not on file  Lifestyle  . Physical activity    Days per week: Not on file    Minutes per session: Not on file  . Stress: Not on file  Relationships  . Social Herbalist on phone: Not on file    Gets together: Not on file    Attends religious service: Not on file    Active member of club or organization: Not on file    Attends meetings of clubs or organizations: Not on file    Relationship status: Not on file  Other Topics Concern  . Not on file  Social History Narrative  . Not on file   Family History  Problem Relation Age of Onset  . Hypertension Mother   . Hypertension Father   . Hypertension Daughter    No Known Allergies Prior to Admission medications   Medication Sig Start Date End Date Taking? Authorizing Provider  Acetaminophen (TYLENOL PO) Take by mouth.   Yes [provider]  amLODipine (NORVASC) 5 MG tablet Take 5 mg by mouth daily.    Yes [provider]  APPLE CIDER VINEGAR PO Take 1 tablet by mouth daily. Gummies   Yes [provider]  Calcium Carbonate-Vit D-Min (CALCIUM 1200 PO) Take 1 tablet by mouth daily.    Yes [provider]  enalapril (VASOTEC) 10 MG tablet Take 10 mg by mouth daily.   Yes [provider]  hydrochlorothiazide (HYDRODIURIL) 25 MG tablet Take 25 mg by mouth daily.   Yes [provider]  meloxicam (MOBIC) 15 MG tablet Take 15 mg by mouth daily.   Yes [provider]  Multiple Vitamins-Minerals (MULTIVITAMIN WITH MINERALS) tablet Take 1 tablet by mouth daily.   Yes [provider]     Positive ROS: All other systems have been reviewed and were otherwise negative with the exception of those mentioned in the HPI and as above.  Physical Exam: General: Alert, no acute distress Cardiovascular: No pedal edema Respiratory: No cyanosis, no use of accessory musculature GI: No organomegaly, abdomen is soft and non-tender Skin: No lesions in the area of chief complaint Neurologic: Sensation intact distally Psychiatric: Patient is competent for consent with normal mood and affect Lymphatic: No axillary or cervical lymphadenopathy  MUSCULOSKELETAL: right knee with 0-115  degrees of motion, crepitance, painful arc  Assessment: Right knee osteoarthritis   Plan: Plan for Procedure(s): TOTAL KNEE ARTHROPLASTY  The risks benefits and alternatives were discussed with the patient including but not limited to the risks of nonoperative treatment, versus surgical intervention including infection, bleeding, nerve injury,  blood clots, cardiopulmonary complications, morbidity, mortality, among others, and they were willing to proceed.   Anticipated LOS equal to or greater than 2 midnights due to - Age 76 and older with one or more of the following:  - Obesity  - Expected need for hospital services (PT, OT, Nursing) required for safe  discharge  - Anticipated need for postoperative skilled nursing care or inpatient rehab  - Active co-morbidities: None OR   - Unanticipated findings during/Post Surgery: None  - Patient is a high risk of re-admission due to: None     Eulas PostJoshua P Cindra Austad, MD Cell [phone]   07/19/2018 7:22 AM

## 2018-07-19 NOTE — Plan of Care (Signed)
progressing 

## 2018-07-19 NOTE — Anesthesia Postprocedure Evaluation (Signed)
Anesthesia Post Note  Patient: Emily Zuniga  Procedure(s) Performed: TOTAL KNEE ARTHROPLASTY (Right Knee)     Patient location during evaluation: PACU Anesthesia Type: Spinal Level of consciousness: oriented and awake and alert Pain management: pain level controlled Vital Signs Assessment: post-procedure vital signs reviewed and stable Respiratory status: spontaneous breathing, respiratory function stable and patient connected to nasal cannula oxygen Cardiovascular status: blood pressure returned to baseline and stable Postop Assessment: no headache, no backache and no apparent nausea or vomiting Anesthetic complications: no    Last Vitals:  Vitals:   07/19/18 1045 07/19/18 1059  BP: (!) 96/55 (!) 96/57  Pulse: (!) 58 (!) 59  Resp: 11 15  Temp: 36.6 C 36.6 C  SpO2: 97% 95%    Last Pain:  Vitals:   07/19/18 1059  TempSrc: Oral  PainSc:                  [name]

## 2018-07-19 NOTE — Anesthesia Procedure Notes (Addendum)
Date/Time: 07/19/2018 6:46 AM Performed by: Sharlette Dense, CRNA Oxygen Delivery Method: Nasal cannula

## 2018-07-19 NOTE — Anesthesia Procedure Notes (Signed)
Spinal  Patient location during procedure: OR Start time: 07/19/2018 7:31 AM End time: 07/19/2018 7:37 AM Staffing Anesthesiologist: Oddono, Ernest, MD Resident/CRNA: Reardon, Diana L, CRNA Performed: anesthesiologist and resident/CRNA  Preanesthetic Checklist Completed: patient identified, site marked, surgical consent, pre-op evaluation, timeout performed, IV checked, risks and benefits discussed and monitors and equipment checked Spinal Block Patient position: sitting Prep: site prepped and draped and DuraPrep Patient monitoring: heart rate, continuous pulse ox and blood pressure Approach: midline Location: L4-5 Injection technique: single-shot Needle Needle type: [place]  Needle gauge: 22 G Needle length: 9 cm Additional Notes Kit expiration date 11/19/2018 and lot #[id number] Clear free flow CSF, negative heme, neg paresthesia Obtained on 3rd attempt  Tolerated well, returned to supine position     

## 2018-07-19 NOTE — Plan of Care (Signed)
Plan of care for post op day 0 discussed with patient. All questions answered.    

## 2018-07-19 NOTE — Care Management Obs Status (Signed)
[facility] NOTIFICATION   Patient Details  Name: Octavie Westerhold MRN: [id number] Date of Birth: January 31, 1940   Medicare Observation Status Notification Given:  Yes    Leeroy Cha, RN 07/19/2018, 3:04 PM

## 2018-07-19 NOTE — Discharge Instructions (Signed)

## 2018-07-19 NOTE — Transfer of Care (Signed)
Immediate Anesthesia Transfer of Care Note  Patient: Kortny Lirette  Procedure(s) Performed: TOTAL KNEE ARTHROPLASTY (Right Knee)  Patient Location: PACU  Anesthesia Type:Spinal  Level of Consciousness: awake and alert   Airway & Oxygen Therapy: Patient Spontanous Breathing and Patient connected to face mask oxygen  Post-op Assessment: Report given to RN and Post -op Vital signs reviewed and stable  Post vital signs: Reviewed and stable  Last Vitals:  Vitals Value Taken Time  BP    Temp    Pulse 70 07/19/18 1010  Resp 13 07/19/18 1010  SpO2 99 % 07/19/18 1010  Vitals shown include unvalidated device data.  Last Pain:  Vitals:   07/19/18 0607  TempSrc:   PainSc: 4       Patients Stated Pain Goal: 4 (71/16/57 9038)  Complications: No apparent anesthesia complications

## 2018-07-19 NOTE — Anesthesia Procedure Notes (Signed)
Date/Time: 07/19/2018 7:29 AM Performed by: Sharlette Dense, CRNA Oxygen Delivery Method: Simple face mask

## 2018-07-19 NOTE — Evaluation (Signed)
Physical Therapy Evaluation Patient Details Name: Emily Zuniga MRN: [id number] DOB: 1940-08-28 Today's Date: 07/19/2018   History of Present Illness  78 yo female s/p R TKA 6/30. Hx of L TKA 2019  Clinical Impression  On eval POD 0, pt required Min assist for mobility. She walked ~100 feet with a RW. Moderate pain with activity. Will follow and progress activity as tolerated. Plan is for d/c home with HHPT f/u.     Follow Up Recommendations Follow surgeon's recommendation for DC plan and follow-up therapies(HHPT)    Equipment Recommendations  None recommended by PT    Recommendations for Other Services       Precautions / Restrictions Precautions Precautions: Fall;Knee Required Braces or Orthoses: Knee Immobilizer - Right Restrictions Weight Bearing Restrictions: No Other Position/Activity Restrictions: WBAT      Mobility  Bed Mobility Overal bed mobility: Needs Assistance Bed Mobility: Supine to Sit     Supine to sit: Min guard;HOB elevated     General bed mobility comments: close guard for safety. increased time. cues for safety.  Transfers Overall transfer level: Needs assistance Equipment used: Rolling walker (2 wheeled) Transfers: Sit to/from Stand Sit to Stand: Min assist         General transfer comment: assist to rise, stabilize, control descent. vcs safety, technique, hand/LE placement.  Ambulation/Gait Ambulation/Gait assistance: Min assist Gait Distance (Feet): 100 Feet Assistive device: Rolling walker (2 wheeled) Gait Pattern/deviations: Step-to pattern     General Gait Details: vcs safety, sequence. assist to stabilize especially towards end of distance 2* fatigue, pain  Stairs            Wheelchair Mobility    Modified Rankin (Stroke Patients Only)       Balance Overall balance assessment: Mild deficits observed, not formally tested                                           Pertinent Vitals/Pain Pain  Assessment: 0-10 Pain Score: 7  Pain Location: r knee Pain Descriptors / Indicators: Aching;Sore Pain Intervention(s): Limited activity within patient's tolerance;Ice applied    Home Living Family/patient expects to be discharged to:: Private residence Living Arrangements: Alone Available Help at Discharge: Family Type of Home: House Home Access: Stairs to enter   Technical brewer of Steps: 1 Home Layout: One level Home Equipment: Environmental consultant - 2 wheels;Bedside commode      Prior Function Level of Independence: Independent               Hand Dominance        Extremity/Trunk Assessment   Upper Extremity Assessment Upper Extremity Assessment: Overall WFL for tasks assessed    Lower Extremity Assessment Lower Extremity Assessment: (post op weakness 2* TKA)    Cervical / Trunk Assessment Cervical / Trunk Assessment: Normal  Communication   Communication: No difficulties  Cognition Arousal/Alertness: Awake/alert Behavior During Therapy: WFL for tasks assessed/performed Overall Cognitive Status: Within Functional Limits for tasks assessed                                        General Comments      Exercises     Assessment/Plan    PT Assessment Patient needs continued PT services  PT Problem List Decreased strength;Decreased range of motion;Decreased mobility;Decreased activity tolerance;Decreased  balance;Decreased knowledge of use of DME;Pain       PT Treatment Interventions DME instruction;Gait training;Therapeutic exercise;Therapeutic activities;Patient/family education;Balance training;Stair training;Functional mobility training    PT Goals (Current goals can be found in the Care Plan section)  Acute Rehab PT Goals Patient Stated Goal: regain independence and plof PT Goal Formulation: With patient Time For Goal Achievement: 08/02/18 Potential to Achieve Goals: Good    Frequency 7X/week   Barriers to discharge         Co-evaluation               AM-PAC PT "6 Clicks" Mobility  Outcome Measure Help needed turning from your back to your side while in a flat bed without using bedrails?: A Little Help needed moving from lying on your back to sitting on the side of a flat bed without using bedrails?: A Little Help needed moving to and from a bed to a chair (including a wheelchair)?: A Little Help needed standing up from a chair using your arms (e.g., wheelchair or bedside chair)?: A Little Help needed to walk in hospital room?: A Little Help needed climbing 3-5 steps with a railing? : A Little 6 Click Score: 18    End of Session Equipment Utilized During Treatment: Gait belt;Right knee immobilizer Activity Tolerance: Patient tolerated treatment well Patient left: in chair;with call bell/phone within reach   PT Visit Diagnosis: Pain;Other abnormalities of gait and mobility (R26.89) Pain - Right/Left: Right Pain - part of body: Knee    Time: [id number] PT Time Calculation (min) (ACUTE ONLY): 13 min   Charges:   PT Evaluation $PT Eval Low Complexity: 1 Low            Emily Zuniga, PT Acute Rehabilitation Services Pager: [phone] Office: [phone]

## 2018-07-19 NOTE — Anesthesia Procedure Notes (Signed)
Anesthesia Regional Block: Adductor canal block   Pre-Anesthetic Checklist: ,, timeout performed, Correct Patient, Correct Site, Correct Laterality, Correct Procedure, Correct Position, site marked, Risks and benefits discussed,  Surgical consent,  Pre-op evaluation,  At surgeon's request and post-op pain management  Laterality: Right  Prep: chloraprep       Needles:  Injection technique: Single-shot  Needle Type: Echogenic Stimulator Needle     Needle Length: 5cm  Needle Gauge: 22     Additional Needles:   Procedures:, nerve stimulator,,, ultrasound used (permanent image in chart),,,,   Nerve Stimulator or Paresthesia:  Response: quadraceps contraction, 0.45 mA,   Additional Responses:   Narrative:  Start time: 07/19/2018 7:00 AM End time: 07/19/2018 7:18 AM Injection made incrementally with aspirations every 5 mL.  Performed by: Personally  Anesthesiologist: Janeece Riggers, MD  Additional Notes: Functioning IV was confirmed and monitors were applied.  A 36mm 22ga Arrow echogenic stimulator needle was used. Sterile prep and drape,hand hygiene and sterile gloves were used. Ultrasound guidance: relevant anatomy identified, needle position confirmed, local anesthetic spread visualized around nerve(s)., vascular puncture avoided.  Image printed for medical record. Negative aspiration and negative test dose prior to incremental administration of local anesthetic. The patient tolerated the procedure well.

## 2018-07-19 NOTE — Care Management CC44 (Signed)
Condition Code 44 Documentation Completed  Patient Details  Name: Emily Zuniga MRN: [id number] Date of Birth: July 23, 1940   Condition Code 44 given:  Yes Patient signature on Condition Code 44 notice:  Yes Documentation of 2 MD's agreement:  Yes Code 44 added to claim:  Yes    Leeroy Cha, RN 07/19/2018, 3:04 PM

## 2018-07-20 ENCOUNTER — Encounter (HOSPITAL_COMMUNITY): Payer: Self-pay | Admitting: Orthopedic Surgery

## 2018-07-20 DIAGNOSIS — M1711 Unilateral primary osteoarthritis, right knee: Secondary | ICD-10-CM | POA: Diagnosis not present

## 2018-07-20 LAB — CBC
HCT: 37.5 % (ref 36.0–46.0)
Hemoglobin: 12.1 g/dL (ref 12.0–15.0)
MCH: 30 pg (ref 26.0–34.0)
MCHC: 32.3 g/dL (ref 30.0–36.0)
MCV: 92.8 fL (ref 80.0–100.0)
Platelets: 212 10*3/uL (ref 150–400)
RBC: 4.04 MIL/uL (ref 3.87–5.11)
RDW: 12.3 % (ref 11.5–15.5)
WBC: 13.3 10*3/uL — ABNORMAL HIGH (ref 4.0–10.5)
nRBC: 0 % (ref 0.0–0.2)

## 2018-07-20 LAB — BASIC METABOLIC PANEL
Anion gap: 9 (ref 5–15)
BUN: 19 mg/dL (ref 8–23)
CO2: 26 mmol/L (ref 22–32)
Calcium: 8.8 mg/dL — ABNORMAL LOW (ref 8.9–10.3)
Chloride: 99 mmol/L (ref 98–111)
Creatinine, Ser: 1.06 mg/dL — ABNORMAL HIGH (ref 0.44–1.00)
GFR calc Af Amer: 59 mL/min — ABNORMAL LOW (ref >60)
GFR calc non Af Amer: 51 mL/min — ABNORMAL LOW (ref >60)
Glucose, Bld: 121 mg/dL — ABNORMAL HIGH (ref 70–99)
Potassium: 4.3 mmol/L (ref 3.5–5.1)
Sodium: 134 mmol/L — ABNORMAL LOW (ref 135–145)

## 2018-07-20 NOTE — Progress Notes (Signed)
Physical Therapy Treatment Patient Details Name: Emily Zuniga MRN: [id number] DOB: 1940-06-27 Today's Date: 07/20/2018    History of Present Illness 78 yo female s/p R TKA 6/30. Hx of L TKA 2019    PT Comments    Progressing with mobility. Pt able to perform Ind SLR on today so KI was not used. Moderate pain with activity this a.m. Practiced/reviewed exercises, gait training, and stair training. All education completed. Okay to d/c from PT standpoint-made RN aware.     Follow Up Recommendations  Follow surgeon's recommendation for DC plan and follow-up therapies(HHPT)     Equipment Recommendations  None recommended by PT    Recommendations for Other Services       Precautions / Restrictions Precautions Precautions: Fall;Knee Required Braces or Orthoses: Knee Immobilizer - Right Restrictions Weight Bearing Restrictions: No Other Position/Activity Restrictions: WBAT    Mobility  Bed Mobility Overal bed mobility: Needs Assistance Bed Mobility: Supine to Sit     Supine to sit: Supervision;HOB elevated     General bed mobility comments: for safety  Transfers Overall transfer level: Needs assistance Equipment used: Rolling walker (2 wheeled) Transfers: Sit to/from Stand Sit to Stand: Supervision         General transfer comment: for safety. Cues technique.  Ambulation/Gait Ambulation/Gait assistance: Min guard Gait Distance (Feet): 115 Feet Assistive device: Rolling walker (2 wheeled) Gait Pattern/deviations: Step-to pattern;Antalgic     General Gait Details: close guard for safety. distance limited by fatigue, pain. slow gait speed.   Stairs Stairs: Yes Stairs assistance: Min guard Stair Management: Step to pattern;Forwards;With walker Number of Stairs: 1 General stair comments: close guard for safety. VCs safety, technique, sequence.   Wheelchair Mobility    Modified Rankin (Stroke Patients Only)       Balance Overall balance assessment: Mild  deficits observed, not formally tested                                          Cognition Arousal/Alertness: Awake/alert Behavior During Therapy: WFL for tasks assessed/performed Overall Cognitive Status: Within Functional Limits for tasks assessed                                        Exercises Total Joint Exercises Ankle Circles/Pumps: AROM;Both;10 reps;Supine Quad Sets: AROM;Both;10 reps;Supine Heel Slides: AAROM;Right;10 reps;Supine Hip ABduction/ADduction: Right;10 reps;Supine;AROM Straight Leg Raises: AROM;Right;10 reps;Supine Goniometric ROM: ~10-65 degrees    General Comments        Pertinent Vitals/Pain Pain Assessment: 0-10 Pain Score: 7  Pain Location: r knee Pain Descriptors / Indicators: Aching;Sore Pain Intervention(s): Monitored during session;Patient requesting pain meds-RN notified;Ice applied    Home Living                      Prior Function            PT Goals (current goals can now be found in the care plan section) Progress towards PT goals: Progressing toward goals    Frequency    7X/week      PT Plan Current plan remains appropriate    Co-evaluation              AM-PAC PT "6 Clicks" Mobility   Outcome Measure  Help needed turning from your back to your side while  in a flat bed without using bedrails?: A Little Help needed moving from lying on your back to sitting on the side of a flat bed without using bedrails?: A Little Help needed moving to and from a bed to a chair (including a wheelchair)?: A Little Help needed standing up from a chair using your arms (e.g., wheelchair or bedside chair)?: A Little Help needed to walk in hospital room?: A Little Help needed climbing 3-5 steps with a railing? : A Little 6 Click Score: 18    End of Session Equipment Utilized During Treatment: Gait belt Activity Tolerance: Patient tolerated treatment well Patient left: in chair;with call  bell/phone within reach   PT Visit Diagnosis: Pain;Other abnormalities of gait and mobility (R26.89) Pain - Right/Left: Right Pain - part of body: Knee     Time: 0301-3143 PT Time Calculation (min) (ACUTE ONLY): 30 min  Charges:  $Gait Training: 8-22 mins $Therapeutic Exercise: 8-22 mins                        [name], PT Acute Rehabilitation Services Pager: [phone] Office: [phone]

## 2018-07-20 NOTE — Plan of Care (Signed)
progressing 

## 2018-07-20 NOTE — Progress Notes (Signed)
Physical Therapy Treatment Patient Details Name: Emily Zuniga MRN: [id number] DOB: 03/19/1940 Today's Date: 07/20/2018    History of Present Illness 78 yo female s/p R TKA 6/30. Hx of L TKA 2019    PT Comments    Progressing with mobility.    Follow Up Recommendations  Follow surgeon's recommendation for DC plan and follow-up therapies(HHPT)     Equipment Recommendations  None recommended by PT    Recommendations for Other Services       Precautions / Restrictions Precautions Precautions: Fall;Knee Required Braces or Orthoses: Knee Immobilizer - Right Restrictions Weight Bearing Restrictions: No Other Position/Activity Restrictions: WBAT    Mobility  Bed Mobility               General bed mobility comments: oob in recliner  Transfers Overall transfer level: Needs assistance Equipment used: Rolling walker (2 wheeled) Transfers: Sit to/from Stand Sit to Stand: Supervision         General transfer comment: for safety, hand placement  Ambulation/Gait Ambulation/Gait assistance: Supervision Gait Distance (Feet): 120 Feet Assistive device: Rolling walker (2 wheeled) Gait Pattern/deviations: Step-to pattern;Step-through pattern;Decreased stride length     General Gait Details: close guard for safety. cues for safety, distance from RW.   Stairs             Wheelchair Mobility    Modified Rankin (Stroke Patients Only)       Balance Overall balance assessment: Mild deficits observed, not formally tested                                          Cognition Arousal/Alertness: Awake/alert Behavior During Therapy: WFL for tasks assessed/performed Overall Cognitive Status: Within Functional Limits for tasks assessed                                        Exercises      General Comments        Pertinent Vitals/Pain Pain Assessment: 0-10 Pain Score: 7  Pain Location: r knee Pain Descriptors / Indicators:  Aching;Sore Pain Intervention(s): Patient requesting pain meds-RN notified;Ice applied    Home Living                      Prior Function            PT Goals (current goals can now be found in the care plan section) Progress towards PT goals: Progressing toward goals    Frequency    7X/week      PT Plan Current plan remains appropriate    Co-evaluation              AM-PAC PT "6 Clicks" Mobility   Outcome Measure  Help needed turning from your back to your side while in a flat bed without using bedrails?: A Little Help needed moving from lying on your back to sitting on the side of a flat bed without using bedrails?: A Little Help needed moving to and from a bed to a chair (including a wheelchair)?: A Little Help needed standing up from a chair using your arms (e.g., wheelchair or bedside chair)?: A Little Help needed to walk in hospital room?: A Little Help needed climbing 3-5 steps with a railing? : A Little 6 Click Score: 18  End of Session   Activity Tolerance: Patient tolerated treatment well Patient left: in chair;with call bell/phone within reach   PT Visit Diagnosis: Pain;Other abnormalities of gait and mobility (R26.89) Pain - Right/Left: Right Pain - part of body: Knee     Time: 4514-6047 PT Time Calculation (min) (ACUTE ONLY): 10 min  Charges:  $Gait Training: 8-22 mins                       [name], [place] Pager: [phone] Office: [phone]

## 2018-07-20 NOTE — TOC Transition Note (Signed)
Transition of Care [facility]) - CM/SW Discharge Note   Patient Details  Name: Emily Zuniga MRN: [id number] Date of Birth: 08-Jan-1941  Transition of Care [facility] CM/SW Contact:  Leeroy Cha, RN Phone Number: 07/20/2018, 9:58 AM   Clinical Narrative:    dcd to home with oopt and has equipment    Final next level of care: OP Rehab Barriers to Discharge: No Barriers Identified   Patient Goals and CMS Choice Patient states their goals for this hospitalization and ongoing recovery are:: to go home and to oopt CMS Medicare.gov Compare Post Acute Care list provided to:: Patient    Discharge Placement                       Discharge Plan and Services   Discharge Planning Services: CM Consult Post Acute Care Choice: Durable Medical Equipment                    HH Arranged: PT [place] Agency: [place] ([facility])        Social Determinants of Health (SDOH) Interventions     Readmission Risk Interventions No flowsheet data found.

## 2018-07-20 NOTE — Progress Notes (Addendum)
Patient ID: Emily Zuniga, female   DOB: 1940/10/28, 78 y.o.   MRN: [id number]     Subjective:  Patient reports pain as mild to moderate.  In bed and in no acute distress   Objective:   VITALS:   Vitals:   07/20/18 0040 07/20/18 0443 07/20/18 0812 07/20/18 1010  BP: 128/69 135/79 126/74 (!) 141/68  Pulse: (!) 48 (!) 49 (!) 57 (!) 57  Resp: 16 16  16   Temp: 97.6 F (36.4 C) (!) 97.5 F (36.4 C)  97.7 F (36.5 C)  TempSrc:    Oral  SpO2: 100% 100%  100%  Weight:      Height:        ABD soft Sensation intact distally Dorsiflexion/Plantar flexion intact Incision: dressing C/D/I and no drainage   Lab Results  Component Value Date   WBC 13.3 (H) 07/20/2018   HGB 12.1 07/20/2018   HCT 37.5 07/20/2018   MCV 92.8 07/20/2018   PLT 212 07/20/2018   BMET    Component Value Date/Time   NA 134 (L) 07/20/2018 0322   K 4.3 07/20/2018 0322   CL 99 07/20/2018 0322   CO2 26 07/20/2018 0322   GLUCOSE 121 (H) 07/20/2018 0322   BUN 19 07/20/2018 0322   CREATININE 1.06 (H) 07/20/2018 0322   CALCIUM 8.8 (L) 07/20/2018 0322   GFRNONAA 51 (L) 07/20/2018 0322   GFRAA 59 (L) 07/20/2018 0322     Assessment/Plan: 1 Day Post-Op   Principal Problem:   Primary localized osteoarthritis of right knee Active Problems:   S/P total knee arthroplasty, right   Advance diet Up with therapy Plan for discharge tomorrow WBAT Dry dressing   Lunette Stands 07/20/2018, 1:08 PM  Discussed and agree with above.  Marchia Bond, MD Cell [phone]

## 2018-07-21 DIAGNOSIS — M1711 Unilateral primary osteoarthritis, right knee: Secondary | ICD-10-CM | POA: Diagnosis not present

## 2018-07-21 LAB — CBC
HCT: 35.2 % — ABNORMAL LOW (ref 36.0–46.0)
Hemoglobin: 11.2 g/dL — ABNORMAL LOW (ref 12.0–15.0)
MCH: 29.5 pg (ref 26.0–34.0)
MCHC: 31.8 g/dL (ref 30.0–36.0)
MCV: 92.6 fL (ref 80.0–100.0)
Platelets: 217 10*3/uL (ref 150–400)
RBC: 3.8 MIL/uL — ABNORMAL LOW (ref 3.87–5.11)
RDW: 12.4 % (ref 11.5–15.5)
WBC: 11 10*3/uL — ABNORMAL HIGH (ref 4.0–10.5)
nRBC: 0 % (ref 0.0–0.2)

## 2018-07-21 NOTE — Progress Notes (Signed)
Physical Therapy Treatment Patient Details Name: Emily Zuniga MRN: [id number] DOB: Jul 18, 1940 Today's Date: 07/21/2018    History of Present Illness 78 yo female s/p R TKA 6/30. Hx of L TKA 2019    PT Comments    POD # 2  Assisted OOB.  Demonstrated and instructed pt how to use belt to self assist LE as a leg lifter Assisted with amb.  General Gait Details: close guard for safety. cues for safety, distance from RW. Practiced one step.  General stair comments: <25% VC's on proper tech and safety.  Then returned to room to perform some TE's following HEP handout.  Instructed on proper tech, freq as well as use of ICE.   Addressed all mobility questions, discussed appropriate activity, educated on use of ICE.  Pt ready for D/C to home.   Follow Up Recommendations  Follow surgeon's recommendation for DC plan and follow-up therapies     Equipment Recommendations  None recommended by PT    Recommendations for Other Services       Precautions / Restrictions Precautions Precautions: Fall;Knee Restrictions Weight Bearing Restrictions: No RLE Weight Bearing: Weight bearing as tolerated    Mobility  Bed Mobility Overal bed mobility: Needs Assistance Bed Mobility: Supine to Sit     Supine to sit: Supervision;Min guard     General bed mobility comments: increased time  Transfers Overall transfer level: Needs assistance Equipment used: Rolling walker (2 wheeled) Transfers: Sit to/from Stand Sit to Stand: Supervision         General transfer comment: <25% VC's on proper hand placement and increased time  Ambulation/Gait Ambulation/Gait assistance: Supervision Gait Distance (Feet): 85 Feet Assistive device: Rolling walker (2 wheeled) Gait Pattern/deviations: Step-to pattern;Step-through pattern;Decreased stride length Gait velocity: decreased   General Gait Details: close guard for safety. cues for safety, distance from RW.   Stairs Stairs: Yes Stairs assistance: Min  guard Stair Management: Step to pattern;Forwards;With walker Number of Stairs: 1 General stair comments: <25% VC's on proper tech and safety   Wheelchair Mobility    Modified Rankin (Stroke Patients Only)       Balance                                            Cognition Arousal/Alertness: Awake/alert Behavior During Therapy: WFL for tasks assessed/performed Overall Cognitive Status: Within Functional Limits for tasks assessed                                        Exercises   Total Knee Replacement TE's 10 reps B LE ankle pumps 10 reps towel squeezes 10 reps knee presses 10 reps heel slides  10 reps SAQ's 10 reps SLR's 10 reps ABD Followed by ICE    General Comments        Pertinent Vitals/Pain Pain Assessment: 0-10 Pain Score: 7  Pain Location: r knee Pain Descriptors / Indicators: Aching;Sore;Operative site guarding Pain Intervention(s): Monitored during session;Repositioned;Ice applied    Home Living                      Prior Function            PT Goals (current goals can now be found in the care plan section) Progress towards PT goals: Progressing toward  goals    Frequency    7X/week      PT Plan Current plan remains appropriate    Co-evaluation              AM-PAC PT "6 Clicks" Mobility   Outcome Measure  Help needed turning from your back to your side while in a flat bed without using bedrails?: A Little Help needed moving from lying on your back to sitting on the side of a flat bed without using bedrails?: A Little Help needed moving to and from a bed to a chair (including a wheelchair)?: A Little Help needed standing up from a chair using your arms (e.g., wheelchair or bedside chair)?: A Little Help needed to walk in hospital room?: A Little Help needed climbing 3-5 steps with a railing? : A Little 6 Click Score: 18    End of Session Equipment Utilized During Treatment: Gait  belt Activity Tolerance: Patient tolerated treatment well Patient left: in chair;with call bell/phone within reach Nurse Communication: Mobility status PT Visit Diagnosis: Pain;Other abnormalities of gait and mobility (R26.89) Pain - Right/Left: Right Pain - part of body: Knee     Time: 8185-6314 PT Time Calculation (min) (ACUTE ONLY): 34 min  Charges:  $Gait Training: 8-22 mins $Therapeutic Exercise: 8-22 mins                     Rica Koyanagi  PTA Acute  Rehabilitation Services Pager      [phone] Office      [phone]

## 2018-07-21 NOTE — Progress Notes (Signed)
The pt was provided with d/c instructions. After discussing the pt's plan of care upon d/c home, the pt reported no further questions or concerns.  

## 2018-07-21 NOTE — Discharge Summary (Signed)
Physician Discharge Summary  Patient ID: Emily Zuniga Zuniga MRN: [id number] DOB/AGE: 1940/08/21 78 y.o.  Admit date: 07/19/2018 Discharge date: 07/21/2018  Admission Diagnoses:  Primary localized osteoarthritis of right knee  Discharge Diagnoses:  Principal Problem:   Primary localized osteoarthritis of right knee Active Problems:   S/P total knee arthroplasty, right   Past Medical History:  Diagnosis Date  . Arthritis of left knee    End Stage  . Hypertension   . Primary localized osteoarthritis of left knee 06/08/2017  . Primary localized osteoarthritis of right knee 07/19/2018    Surgeries: Procedure(s): TOTAL KNEE ARTHROPLASTY on 07/19/2018   Consultants (if any):   Discharged Condition: Improved  Hospital Course: Emily Zuniga [place] is an 78 y.o. female who was admitted 07/19/2018 with a diagnosis of Primary localized osteoarthritis of right knee and went to the operating room on 07/19/2018 and underwent the above named procedures.    She was given perioperative antibiotics:  Anti-infectives (From admission, onward)   Start     Dose/Rate Route Frequency Ordered Stop   07/19/18 1400  ceFAZolin (ANCEF) IVPB 2g/100 mL premix     2 g 200 mL/hr over 30 Minutes Intravenous Every 6 hours 07/19/18 1105 07/19/18 2021   07/19/18 0600  ceFAZolin (ANCEF) IVPB 2g/100 mL premix     2 g 200 mL/hr over 30 Minutes Intravenous On call to O.R. 07/19/18 [id number] 07/19/18 [id number]    .  She was given sequential compression devices, early ambulation, and aspirin for DVT prophylaxis.  She benefited maximally from the hospital stay and there were no complications.    Recent vital signs:  Vitals:   07/21/18 0327 07/21/18 0818  BP: (!) 144/73 (!) 165/89  Pulse: (!) 49 (!) 52  Resp: 20   Temp: 97.7 F (36.5 C)   SpO2: 97%     Recent laboratory studies:  Lab Results  Component Value Date   HGB 11.2 (L) 07/21/2018   HGB 12.1 07/20/2018   HGB 14.3 07/07/2018   Lab Results  Component Value Date   WBC  11.0 (H) 07/21/2018   PLT 217 07/21/2018   No results found for: INR Lab Results  Component Value Date   NA 134 (L) 07/20/2018   K 4.3 07/20/2018   CL 99 07/20/2018   CO2 26 07/20/2018   BUN 19 07/20/2018   CREATININE 1.06 (H) 07/20/2018   GLUCOSE 121 (H) 07/20/2018    Discharge Medications:   Allergies as of 07/21/2018   No Known Allergies     Medication List    STOP taking these medications   TYLENOL PO     TAKE these medications   amLODipine 5 MG tablet Commonly known as: NORVASC Take 5 mg by mouth daily.   APPLE CIDER VINEGAR PO Take 1 tablet by mouth daily. Gummies   aspirin EC 325 MG tablet Take 1 tablet (325 mg total) by mouth 2 (two) times daily.   baclofen 10 MG tablet Commonly known as: LIORESAL Take 1 tablet (10 mg total) by mouth 3 (three) times daily. As needed for muscle spasm   CALCIUM 1200 PO Take 1 tablet by mouth daily.   enalapril 10 MG tablet Commonly known as: VASOTEC Take 10 mg by mouth daily.   hydrochlorothiazide 25 MG tablet Commonly known as: HYDRODIURIL Take 25 mg by mouth daily.   HYDROcodone-acetaminophen 10-325 MG tablet Commonly known as: Norco Take 1 tablet by mouth every 6 (six) hours as needed.   meloxicam 15 MG tablet Commonly known as: MOBIC  Take 15 mg by mouth daily.   multivitamin with minerals tablet Take 1 tablet by mouth daily.   sennosides-docusate sodium 8.6-50 MG tablet Commonly known as: SENOKOT-S Take 2 tablets by mouth daily.       Diagnostic Studies: Dg Knee Right Port  Result Date: 07/19/2018 CLINICAL DATA:  Status post right knee replacement EXAM: PORTABLE RIGHT KNEE - 2 VIEW COMPARISON:  None. FINDINGS: Right knee prosthesis is noted in satisfactory position. No acute bony or soft tissue abnormality is noted. IMPRESSION: Status post right knee replacement. Electronically Signed   By: Inez Catalina M.D.   On: 07/19/2018 11:10    Disposition:     Follow-up Information    Marchia Bond, MD.  Go on 08/01/2018.   Specialty: Orthopedic Surgery Why: Your appointment is scheduled for 10:00 Contact information: [place] [address] [phone]        [facility] Follow up.   Specialty: Home Health Services Why: You will have 5 HHPT visits prior to starting outpatient physical therapy.        [employer] Go on 08/01/2018.   Why: You will start outpatient physical therapy directly after seeing Dr. Mardelle Matte. Your appointment time is 11:00. Please go directly over to the therapy side after your MD appointment  Contact information: [facility] [place] [address] [phone]            Signed: Johnny Bridge 07/21/2018, 10:17 AM

## 2019-02-09 ENCOUNTER — Ambulatory Visit: Payer: Medicare HMO | Attending: Internal Medicine

## 2019-02-09 DIAGNOSIS — Z23 Encounter for immunization: Secondary | ICD-10-CM | POA: Insufficient documentation

## 2019-02-09 NOTE — Progress Notes (Signed)
   Covid-19 Vaccination Clinic  Name:  Emily Zuniga    MRN: [id number] DOB: 1940/05/28  02/09/2019  [name] was observed post Covid-19 immunization for 15 minutes without incidence. She was provided with Vaccine Information Sheet and instruction to access the V-Safe system.   [name] was instructed to call 911 with any severe reactions post vaccine: Marland Kitchen Difficulty breathing  . Swelling of your face and throat  . A fast heartbeat  . A bad rash all over your body  . Dizziness and weakness    Immunizations Administered    Name Date Dose VIS Date Route   Pfizer COVID-19 Vaccine 02/09/2019  1:42 PM 0.3 mL 12/30/2018 Intramuscular   Manufacturer: [employer], [employer]   Lot: [id number]   NDC: [address]-8

## 2019-03-02 ENCOUNTER — Ambulatory Visit: Payer: Medicare HMO | Attending: Internal Medicine

## 2019-03-02 DIAGNOSIS — Z23 Encounter for immunization: Secondary | ICD-10-CM | POA: Insufficient documentation

## 2019-03-02 NOTE — Progress Notes (Signed)
   Covid-19 Vaccination Clinic  Name:  Bindi Klomp    MRN: [id number] DOB: June 30, 1940  03/02/2019  Ms. Lonardo was observed post Covid-19 immunization for 15 minutes without incidence. She was provided with Vaccine Information Sheet and instruction to access the V-Safe system.   Ms. Kinzie was instructed to call 911 with any severe reactions post vaccine: Marland Kitchen Difficulty breathing  . Swelling of your face and throat  . A fast heartbeat  . A bad rash all over your body  . Dizziness and weakness    Immunizations Administered    Name Date Dose VIS Date Route   Pfizer COVID-19 Vaccine 03/02/2019  2:38 PM 0.3 mL 12/30/2018 Intramuscular   Manufacturer: [employer], [employer]   Lot: [id number]   NDC: [address]-7

## 2019-09-03 ENCOUNTER — Encounter (HOSPITAL_BASED_OUTPATIENT_CLINIC_OR_DEPARTMENT_OTHER): Payer: Self-pay

## 2019-09-03 ENCOUNTER — Emergency Department (HOSPITAL_BASED_OUTPATIENT_CLINIC_OR_DEPARTMENT_OTHER): Payer: Medicare HMO

## 2019-09-03 ENCOUNTER — Emergency Department (HOSPITAL_BASED_OUTPATIENT_CLINIC_OR_DEPARTMENT_OTHER)
Admission: EM | Admit: 2019-09-03 | Discharge: 2019-09-04 | Disposition: A | Discharge status: Home / Self Care | Payer: Medicare HMO | Attending: Emergency Medicine | Admitting: Emergency Medicine

## 2019-09-03 ENCOUNTER — Other Ambulatory Visit: Payer: Self-pay

## 2019-09-03 DIAGNOSIS — Z96651 Presence of right artificial knee joint: Secondary | ICD-10-CM | POA: Diagnosis not present

## 2019-09-03 DIAGNOSIS — M19011 Primary osteoarthritis, right shoulder: Secondary | ICD-10-CM | POA: Diagnosis not present

## 2019-09-03 DIAGNOSIS — Z79899 Other long term (current) drug therapy: Secondary | ICD-10-CM | POA: Diagnosis not present

## 2019-09-03 DIAGNOSIS — Z96652 Presence of left artificial knee joint: Secondary | ICD-10-CM | POA: Insufficient documentation

## 2019-09-03 DIAGNOSIS — M79601 Pain in right arm: Secondary | ICD-10-CM

## 2019-09-03 DIAGNOSIS — M1990 Unspecified osteoarthritis, unspecified site: Secondary | ICD-10-CM

## 2019-09-03 DIAGNOSIS — Z7982 Long term (current) use of aspirin: Secondary | ICD-10-CM | POA: Insufficient documentation

## 2019-09-03 DIAGNOSIS — I10 Essential (primary) hypertension: Secondary | ICD-10-CM | POA: Diagnosis not present

## 2019-09-03 MED ORDER — IBUPROFEN 400 MG PO TABS
400.0000 mg | ORAL_TABLET | Freq: Once | ORAL | Status: AC
  Administered 2019-09-03: 400 mg via ORAL
  Filled 2019-09-03: qty 1

## 2019-09-03 NOTE — ED Provider Notes (Signed)
[facility] EMERGENCY DEPARTMENT Provider Note   CSN: [id number] Arrival date & time: 09/03/19  2011     History Chief Complaint  Patient presents with  . Arm Pain    Emily Zuniga is a 79 y.o. female.  HPI     This is a 79 year old female with a history of hypertension, arthritis who presents with right arm pain.  Patient reports she is having waxing and waning right arm pain.  She describes pain and achiness in her hand, right forearm, and right shoulder.  She states she has had decreased range of motion of the right shoulder and pain for some time.  She denies radiation of the pain from her shoulder to her hand.  She denies numbness or tingling.  No difficulty gripping.  She states sometimes the pain gets so bad that she has to help move her arm with her left arm.  She is right-handed.  Currently she rates her pain at 5 out of 10.  She has been taking prednisone 10 mg daily for right thumb issue.  She is unsure whether this is helping at all.  She denies injury.  Past Medical History:  Diagnosis Date  . Arthritis of left knee    End Stage  . Hypertension   . Primary localized osteoarthritis of left knee 06/08/2017  . Primary localized osteoarthritis of right knee 07/19/2018    Patient Active Problem List   Diagnosis Date Noted  . Primary localized osteoarthritis of right knee 07/19/2018  . S/P total knee arthroplasty, right 07/19/2018  . Primary localized osteoarthritis of left knee 06/08/2017  . S/P knee replacement 06/08/2017    Past Surgical History:  Procedure Laterality Date  . COLONOSCOPY W/ POLYPECTOMY    . TOTAL KNEE ARTHROPLASTY Left 06/08/2017  . TOTAL KNEE ARTHROPLASTY Left 06/08/2017   Procedure: TOTAL KNEE ARTHROPLASTY;  Surgeon: Teryl Lucy, MD;  Location: [facility] OR;  Service: Orthopedics;  Laterality: Left;  . TOTAL KNEE ARTHROPLASTY Right 07/19/2018   Procedure: TOTAL KNEE ARTHROPLASTY;  Surgeon: Teryl Lucy, MD;  Location: [facility] ORS;  Service:  Orthopedics;  Laterality: Right;     OB History   No obstetric history on file.     Family History  Problem Relation Age of Onset  . Hypertension Mother   . Hypertension Father   . Hypertension Daughter     Social History   Tobacco Use  . Smoking status: Never Smoker  . Smokeless tobacco: Never Used  Vaping Use  . Vaping Use: Never used  Substance Use Topics  . Alcohol use: No  . Drug use: No    Home Medications Prior to Admission medications   Medication Sig Start Date End Date Taking? Authorizing Provider  amLODipine (NORVASC) 5 MG tablet Take 5 mg by mouth daily.     [provider]  APPLE CIDER VINEGAR PO Take 1 tablet by mouth daily. Gummies    [provider]  aspirin EC 325 MG tablet Take 1 tablet (325 mg total) by mouth 2 (two) times daily. 07/19/18   Teryl Lucy, MD  baclofen (LIORESAL) 10 MG tablet Take 1 tablet (10 mg total) by mouth 3 (three) times daily. As needed for muscle spasm 07/19/18   Teryl Lucy, MD  Calcium Carbonate-Vit D-Min (CALCIUM 1200 PO) Take 1 tablet by mouth daily.     [provider]  enalapril (VASOTEC) 10 MG tablet Take 10 mg by mouth daily.    [provider]  hydrochlorothiazide (HYDRODIURIL) 25 MG tablet  Take 25 mg by mouth daily.    [provider]  HYDROcodone-acetaminophen (NORCO/VICODIN) 5-325 MG tablet Take 1 tablet by mouth every 6 (six) hours as needed. 09/04/19   Morissa Obeirne, Mayer Masker, MD  meloxicam (MOBIC) 15 MG tablet Take 15 mg by mouth daily.    [provider]  Multiple Vitamins-Minerals (MULTIVITAMIN WITH MINERALS) tablet Take 1 tablet by mouth daily.    [provider]  sennosides-docusate sodium (SENOKOT-S) 8.6-50 MG tablet Take 2 tablets by mouth daily. 07/19/18   Teryl Lucy, MD    Allergies    Patient has no known allergies.  Review of Systems   Review of Systems  Constitutional: Negative for fever.  Respiratory: Negative for shortness of breath.     Cardiovascular: Negative for chest pain.  Musculoskeletal:       Right arm pain and shoulder pain  Neurological: Negative for weakness and numbness.  All other systems reviewed and are negative.   Physical Exam Updated Vital Signs BP (!) 157/77   Pulse 71   Temp 97.9 F (36.6 C) (Oral)   Resp 20   Ht 1.575 m (5\' 2" )   Wt 102.1 kg   SpO2 100%   BMI 41.15 kg/m   Physical Exam Vitals and nursing note reviewed.  Constitutional:      Appearance: She is well-developed.     Comments: Elderly, nontoxic-appearing, no acute distress  HENT:     Head: Normocephalic and atraumatic.     Nose: Nose normal.     Mouth/Throat:     Mouth: Mucous membranes are moist.  Eyes:     Pupils: Pupils are equal, round, and reactive to light.  Neck:     Comments: No midline tenderness palpation, step-off, deformity Cardiovascular:     Rate and Rhythm: Normal rate and regular rhythm.  Pulmonary:     Effort: Pulmonary effort is normal. No respiratory distress.  Abdominal:     Palpations: Abdomen is soft.  Musculoskeletal:     Cervical back: Neck supple.     Comments: Pain with abduction of the right shoulder, limited range of motion secondary to pain, normal flexion extension of the elbow and wrist, no obvious deformities, no swelling  Skin:    General: Skin is warm and dry.  Neurological:     Mental Status: She is alert and oriented to person, place, and time.     Comments: 5 out of 5 strength grip, biceps, triceps, deltoids bilaterally  Psychiatric:        Mood and Affect: Mood normal.     ED Results / Procedures / Treatments   Labs (all labs ordered are listed, but only abnormal results are displayed) Labs Reviewed - No data to display  EKG None  Radiology DG Shoulder Right  Result Date: 09/04/2019 CLINICAL DATA:  Initial evaluation for acute right shoulder pain. No injury. EXAM: RIGHT SHOULDER - 2+ VIEW COMPARISON:  None available. FINDINGS: No acute fracture dislocation.  Humeral head in normal alignment with the glenoid. AC joint approximated. Moderate to advanced osteoarthritic changes about the acromioclavicular and glenohumeral articulations. No periarticular calcification. No visible soft tissue injury. Visualized right hemithorax grossly clear. IMPRESSION: 1. Moderate to advanced osteoarthritic changes about the right acromioclavicular and glenohumeral articulations. 2. No acute osseous abnormality. Electronically Signed   By: 09/06/2019 M.D.   On: 09/04/2019 00:19    Procedures Procedures (including critical care time)  Medications Ordered in ED Medications  ibuprofen (ADVIL) tablet 400 mg (400 mg Oral Given  09/03/19 2355)    ED Course  I have reviewed the triage vital signs and the nursing notes.  Pertinent labs & imaging results that were available during my care of the patient were reviewed by me and considered in my medical decision making (see chart for details).    MDM Rules/Calculators/A&P                           Patient presents with right arm pain.  At times in the right shoulder.  Denies any weakness or numbness.  Has had pain in the forearm and hand as well.  Pain does not necessarilyradiate.  She is neurovascular intact.  No weakness.  No objective neurologic deficit.  History for physical exam is isolated to her shoulder.  X-rays show moderate to advanced osteoarthritis.  Given her age, would hesitate to put her on anti-inflammatories.  Recommend her continuing prednisone and will give her a short course of Norco for breakthrough pain.  Recommend early range of motion exercises and orthopedic follow-up.  No signs or symptoms of infection or septic arthritis.  After history, exam, and medical workup I feel the patient has been appropriately medically screened and is safe for discharge home. Pertinent diagnoses were discussed with the patient. Patient was given return precautions.   Final Clinical Impression(s) / ED  Diagnoses Final diagnoses:  Right arm pain  Arthritis    Rx / DC Orders ED Discharge Orders         Ordered    HYDROcodone-acetaminophen (NORCO/VICODIN) 5-325 MG tablet  Every 6 hours PRN     Discontinue  Reprint     09/04/19 0048           Wilkie Aye, Mayer Masker, MD 09/04/19 0050

## 2019-09-03 NOTE — ED Triage Notes (Signed)
Pt c/o pain to R shoulder and arm pain that sometimes limits her ROM. Symptoms x "a few weeks." Denies specific injury, but pt does work out in [employer], [employer] and does frequent yard work.

## 2019-09-04 MED ORDER — HYDROCODONE-ACETAMINOPHEN 5-325 MG PO TABS: 1.0000 | ORAL_TABLET | Freq: Four times a day (QID) | ORAL | 0 refills | Status: DC | PRN

## 2019-09-04 NOTE — Discharge Instructions (Addendum)
You were seen today for right arm pain.  Your x-rays show extensive arthritis of the right shoulder.  Continue your prednisone.  You will be given a short course of Norco for ongoing pain.  Do not drive while taking Norco.  Follow-up with sports medicine.

## 2019-09-20 ENCOUNTER — Telehealth: Payer: Self-pay | Admitting: Family Medicine

## 2019-09-20 NOTE — Telephone Encounter (Signed)
Called pt to offer f/u appt w/ Dr.Schmitz for arm pain--Left message for her to contact office if still in pain & interested in follow-up w/ provider.  glh

## 2019-12-21 ENCOUNTER — Encounter: Payer: Medicare HMO | Admitting: Sports Medicine

## 2020-02-26 IMAGING — DX PORTABLE RIGHT KNEE - 1-2 VIEW
2 series · 2 of 2 positions shown · non-contrast
Comparison: None.

CLINICAL DATA: Status post right knee replacement

EXAM:
PORTABLE RIGHT KNEE - 2 VIEW

[knee ap]
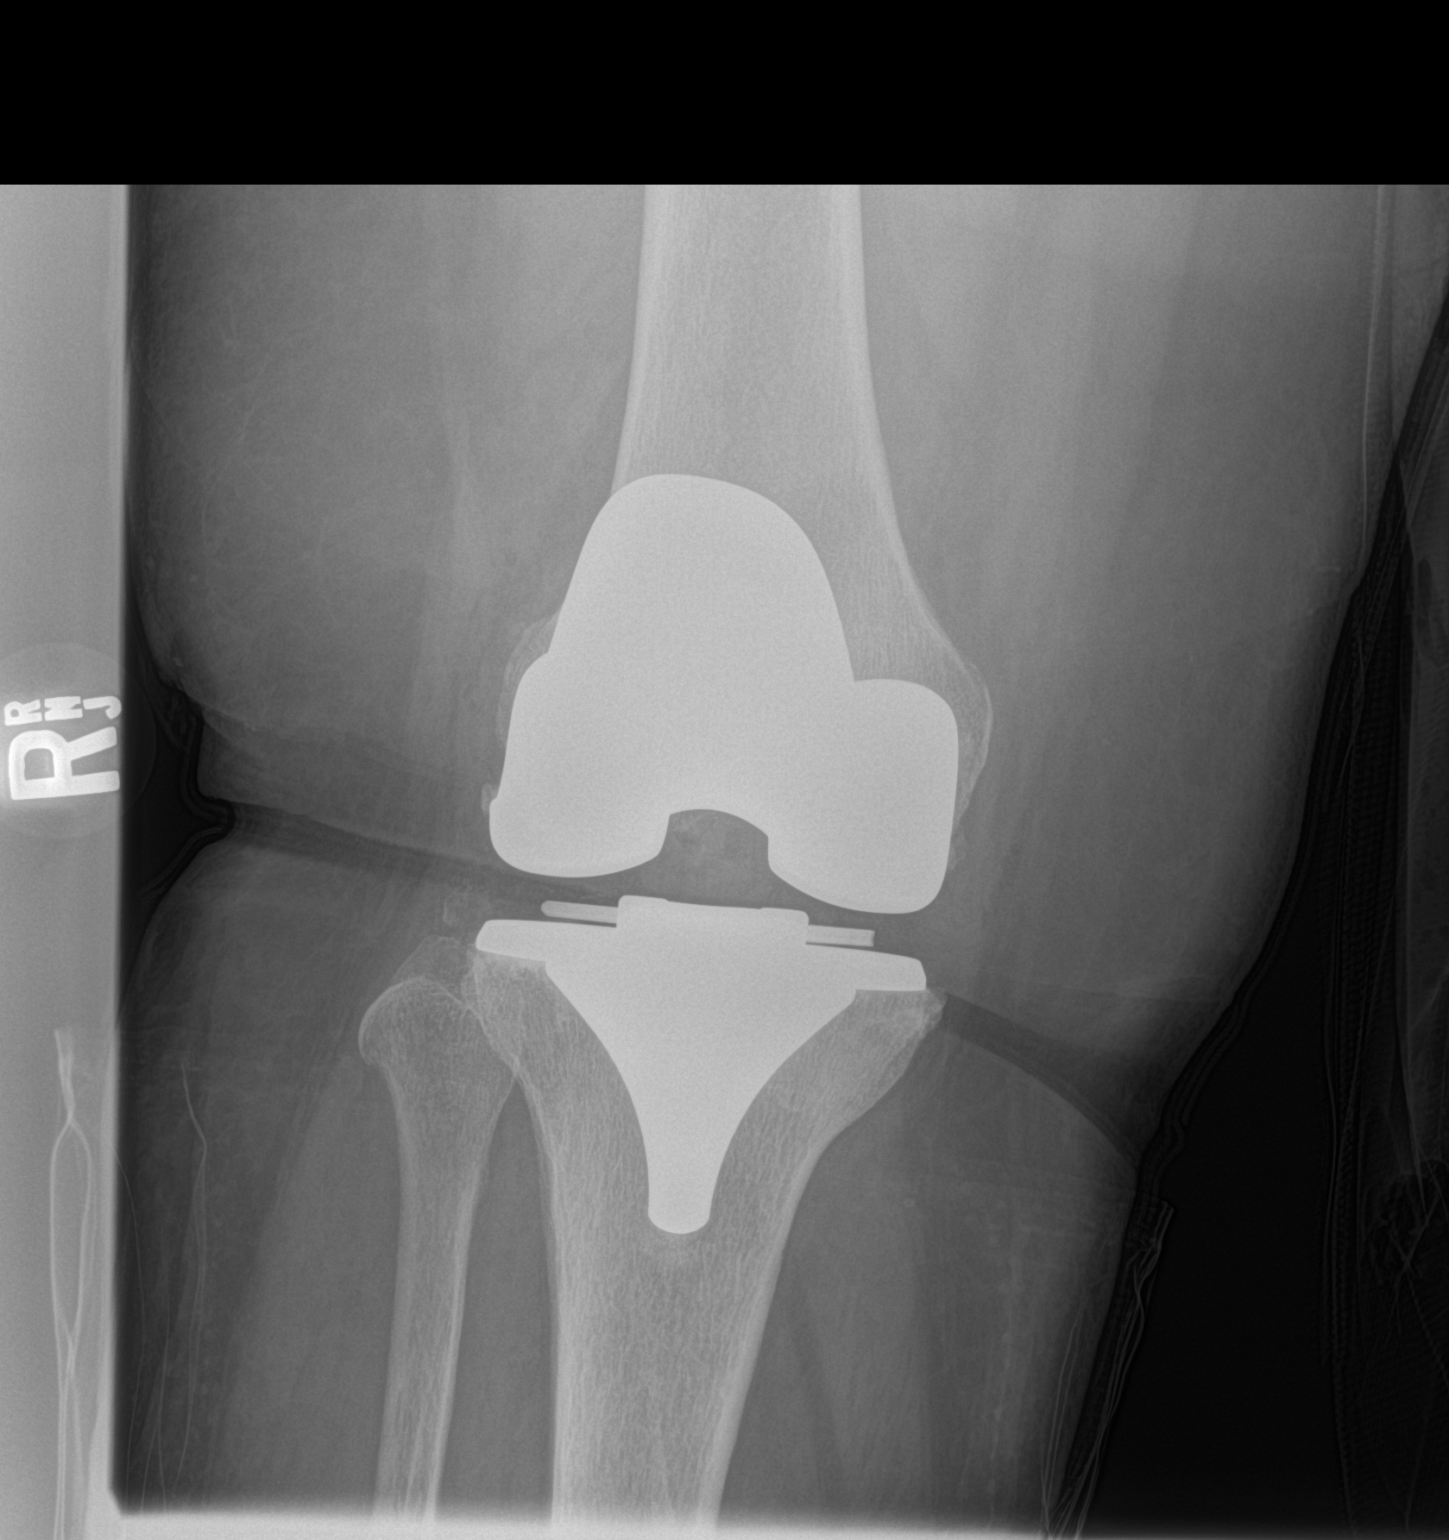

[knee lat]
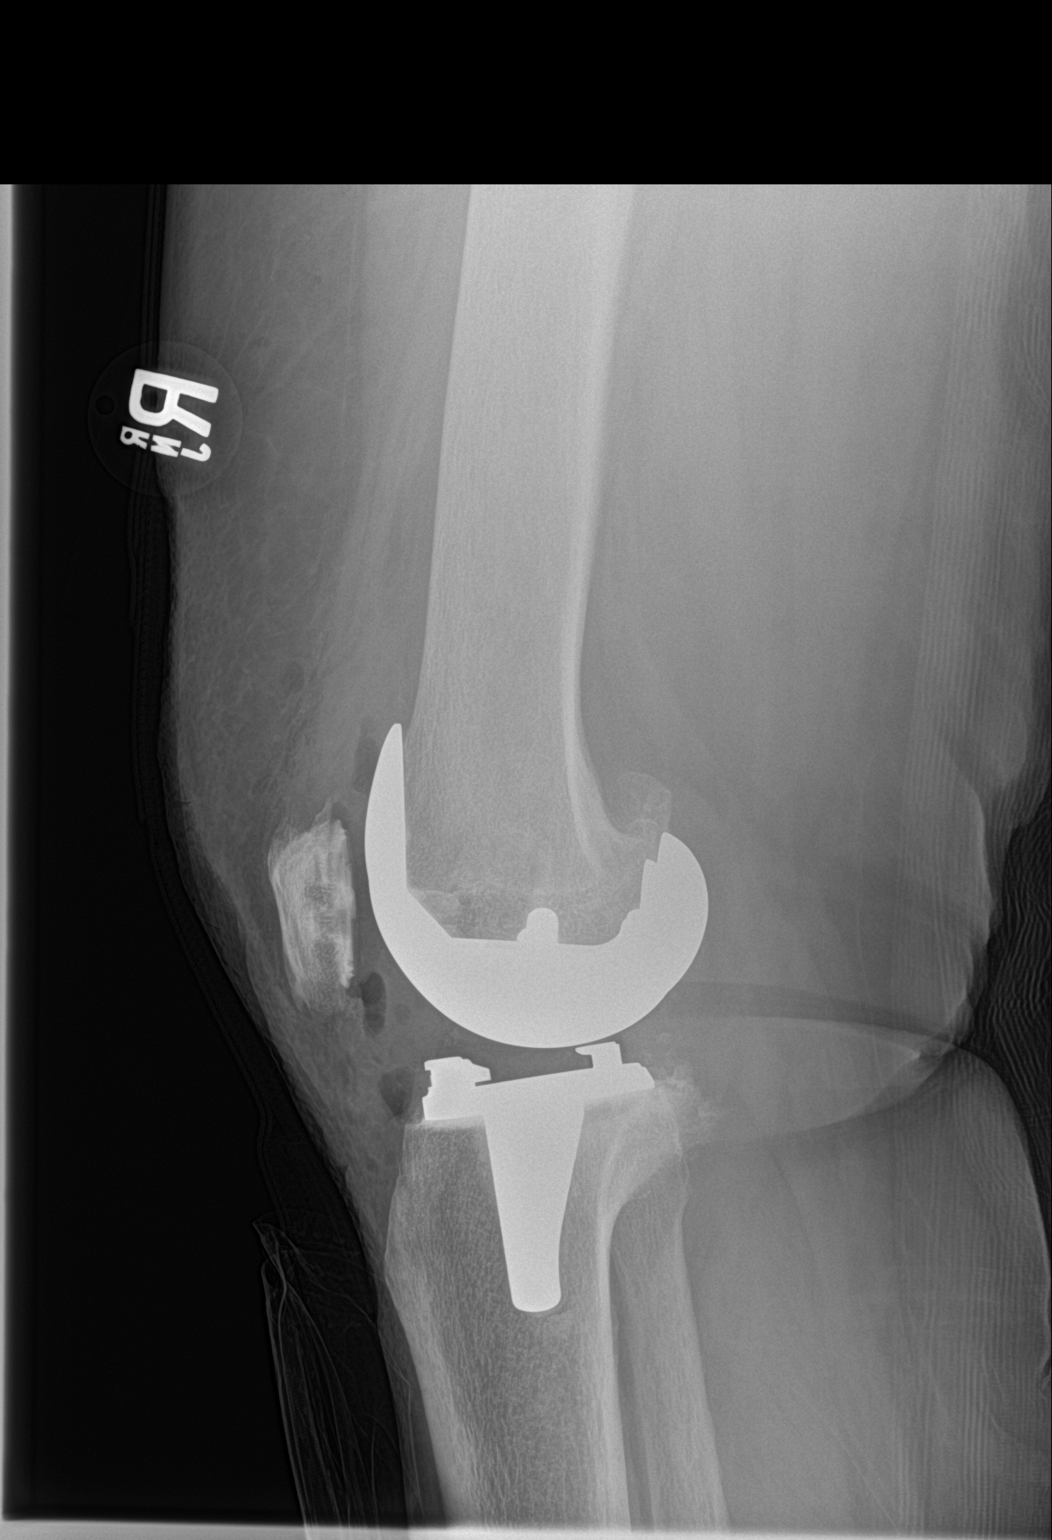

[2 of 2 positions shown; findings below may reference images not displayed]

FINDINGS: Right knee prosthesis is noted in satisfactory position. No acute
bony or soft tissue abnormality is noted.
IMPRESSION: Status post right knee replacement.

## 2021-01-28 ENCOUNTER — Other Ambulatory Visit: Payer: Self-pay | Admitting: Internal Medicine

## 2021-01-28 ENCOUNTER — Ambulatory Visit
Admission: RE | Admit: 2021-01-28 | Discharge: 2021-01-28 | Disposition: A | Discharge status: Home / Self Care | Payer: Medicare HMO | Source: Ambulatory Visit | Attending: Internal Medicine | Admitting: Internal Medicine

## 2021-01-28 DIAGNOSIS — R0781 Pleurodynia: Secondary | ICD-10-CM

## 2021-09-04 ENCOUNTER — Encounter: Payer: Self-pay | Admitting: Podiatry

## 2021-09-04 ENCOUNTER — Ambulatory Visit: Payer: Medicare HMO | Admitting: Podiatry

## 2021-09-04 ENCOUNTER — Ambulatory Visit (INDEPENDENT_AMBULATORY_CARE_PROVIDER_SITE_OTHER): Payer: Medicare HMO

## 2021-09-04 DIAGNOSIS — M722 Plantar fascial fibromatosis: Secondary | ICD-10-CM

## 2021-09-04 DIAGNOSIS — M76822 Posterior tibial tendinitis, left leg: Secondary | ICD-10-CM | POA: Diagnosis not present

## 2021-09-04 MED ORDER — TRIAMCINOLONE ACETONIDE 10 MG/ML IJ SUSP
10.0000 mg | Freq: Once | INTRAMUSCULAR | Status: AC
  Administered 2021-09-04: 10 mg

## 2021-09-04 NOTE — Progress Notes (Signed)
Subjective:   Patient ID: Emily Zuniga, female   DOB: 81 y.o.   MRN: [id number]   HPI Patient presents stating that she has had a lot of pain on the inside of her left ankle which has been present for a fairly long time but is worsened over the last few months and she also has some swelling in her ankles bilateral and feels like her arch is lower on the left side and has gotten that way over the last couple years.  Patient does not smoke likes to be active and is wearing very flat shoes   Review of Systems  All other systems reviewed and are negative.       Objective:  Physical Exam Vitals and nursing note reviewed.  Constitutional:      Appearance: She is well-developed.  Pulmonary:     Effort: Pulmonary effort is normal.  Musculoskeletal:        General: Normal range of motion.  Skin:    General: Skin is warm.  Neurological:     Mental Status: She is alert.     Neurovascular status found to be intact muscle strength found to be adequate range of motion adequate with moderate depression of the arch left over right collapse of the medial longitudinal arch inflammation pain of the posterior tibial tendon left at its insertion into the navicular and more proximal but no indications of muscle strength loss or pathology.  Moderate edema around the ankle region bilateral negative for pitting     Assessment:  Probability that she has a inflammatory condition posterior tibial tendon left and has quite a collapse of the medial longitudinal arch and is wearing weight to flat of shoes     Plan:  Age be discussed conditions and swelling and elevation compression can be used for that.  Today I did a sterile prep did inject the sheath of the posterior tib as it comes underneath the medial malleolus 3 mg Dexasone Kenalog 5 mg Xylocaine applied fascial brace to lift up the arch and take stress off the tendon advised on reduced activity and support shoes.  Reappoint 3 weeks may require MRI if  symptoms persist or immobilization  X-rays indicate moderate collapse of the left arch over the right with spur formation noted plantarly no indication subtalar joint arthritis currently

## 2021-09-25 ENCOUNTER — Ambulatory Visit: Payer: Medicare HMO | Admitting: Podiatry

## 2021-09-25 ENCOUNTER — Encounter: Payer: Self-pay | Admitting: Podiatry

## 2021-09-25 DIAGNOSIS — M722 Plantar fascial fibromatosis: Secondary | ICD-10-CM | POA: Diagnosis not present

## 2021-09-26 NOTE — Progress Notes (Signed)
Subjective:   Patient ID: Emily Zuniga, female   DOB: 81 y.o.   MRN: [id number]   HPI Patient states she is doing much better and very happy with how her foot feels   ROS      Objective:  Physical Exam  Neuro vascular status intact patient's left plantar fascia is improved with discomfort only upon deep palpation and walking with a better heel toe gait pattern     Assessment:  Treatment of Planter fasciitis left diminishment of discomfort     Plan:  Reviewed continue stretching exercises and shoe gear modifications and patient will be seen back as needed

## 2021-10-14 ENCOUNTER — Telehealth: Payer: Self-pay | Admitting: *Deleted

## 2021-10-14 NOTE — Telephone Encounter (Signed)
Patient called to ask the name of physician seen at office, information given.

## 2022-01-14 ENCOUNTER — Ambulatory Visit: Payer: Medicare HMO | Admitting: Podiatry

## 2022-05-07 ENCOUNTER — Encounter: Payer: Self-pay | Admitting: *Deleted

## 2022-07-29 ENCOUNTER — Ambulatory Visit
Admission: RE | Admit: 2022-07-29 | Discharge: 2022-07-29 | Disposition: A | Discharge status: Home / Self Care | Payer: Medicare HMO | Source: Ambulatory Visit | Attending: Internal Medicine | Admitting: Internal Medicine

## 2022-07-29 ENCOUNTER — Other Ambulatory Visit: Payer: Self-pay | Admitting: Internal Medicine

## 2022-07-29 DIAGNOSIS — R059 Cough, unspecified: Secondary | ICD-10-CM

## 2022-07-29 DIAGNOSIS — M25511 Pain in right shoulder: Secondary | ICD-10-CM

## 2024-01-18 ENCOUNTER — Encounter: Payer: Self-pay | Admitting: Emergency Medicine

## 2024-01-18 ENCOUNTER — Other Ambulatory Visit: Payer: Self-pay

## 2024-01-18 ENCOUNTER — Ambulatory Visit
Admission: EM | Admit: 2024-01-18 | Discharge: 2024-01-18 | Disposition: A | Discharge status: Home / Self Care | Attending: Nurse Practitioner | Admitting: Nurse Practitioner

## 2024-01-18 DIAGNOSIS — R10A2 Flank pain, left side: Secondary | ICD-10-CM | POA: Diagnosis not present

## 2024-01-18 LAB — POCT URINE DIPSTICK
Bilirubin, UA: NEGATIVE
Blood, UA: NEGATIVE
Glucose, UA: NEGATIVE mg/dL
Ketones, POC UA: NEGATIVE mg/dL
Leukocytes, UA: NEGATIVE
Nitrite, UA: NEGATIVE
POC PROTEIN,UA: NEGATIVE
Spec Grav, UA: 1.01
Urobilinogen, UA: 0.2 U/dL
pH, UA: 5.5

## 2024-01-18 MED ORDER — LIDOCAINE 5 % EX PTCH: 1.0000 | MEDICATED_PATCH | CUTANEOUS | 0 refills | Status: AC

## 2024-01-18 NOTE — Discharge Instructions (Signed)
 Your discomfort is likely a muscle strain related to your workout in the gym Your urine did not show blood or signs of an infection Take two ibuprofen  and alternate with your tylenol  arthritis for two days Apply the lidocaine  patch daily for 12 hours as needed Use heat compresses to soothe the muscle Monitor closely for any symptoms such as worsening pain, lower extremity numbness, loss of bowel/bladder, or abdominal pain.

## 2024-01-18 NOTE — ED Triage Notes (Signed)
 Pt here for left sided flank pain x 1 week that started when dumping water  in sink; pt sts pan is sharp and intermittent; worse with movement

## 2024-01-18 NOTE — ED Provider Notes (Signed)
 " [facility]    CSN: [id number] Arrival date & time: 01/18/24  1849      History   Chief Complaint Chief Complaint  Patient presents with   Flank Pain    HPI Emily Zuniga is a 83 y.o. female.   Patient presents for evaluation of left flank pain that started approximately one week ago after being in the gym.  States the pain has come and gone, occasionally bothers her when she moves.  She has taken Tylenol  Arthritis without any resolution.  No lower extremity weakness or paresthesias, no abdominal pain, vomiting, or diarrhea.  Denies any urinary symptoms, loss of bowel or bladder.  Pain has gradually intensified today.  No URI or symptoms of febrile illness.  The history is provided by the patient.  Flank Pain Pertinent negatives include no chest pain, no abdominal pain, no headaches and no shortness of breath.    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   Primary localized osteoarthritis of left knee 06/08/2017   S/P knee replacement 06/08/2017    Past Surgical History:  Procedure Laterality Date   COLONOSCOPY W/ POLYPECTOMY     TOTAL KNEE ARTHROPLASTY Left 06/08/2017   TOTAL KNEE ARTHROPLASTY Left 06/08/2017   Procedure: TOTAL KNEE ARTHROPLASTY;  Surgeon: Josefina Chew, MD;  Location: [facility] OR;  Service: Orthopedics;  Laterality: Left;   TOTAL KNEE ARTHROPLASTY Right 07/19/2018   Procedure: TOTAL KNEE ARTHROPLASTY;  Surgeon: Josefina Chew, MD;  Location: [facility];  Service: Orthopedics;  Laterality: Right;    OB History   No obstetric history on file.      Home Medications    Prior to Admission medications  Medication Sig  Start Date End Date Taking? Authorizing Provider  lidocaine  (LIDODERM ) 5 % Place 1 patch onto the skin daily. Only leave on skin for 12 hours at a time (12 hours on, 12 hours off). 01/18/24  Yes Janet Therisa PARAS, FNP  amLODipine  (NORVASC ) 5 MG tablet Take 5 mg by mouth daily.     [provider]  Calcium  Carbonate-Vit D-Min (CALCIUM  1200 PO) Take 1 tablet by mouth daily.     [provider]  enalapril  (VASOTEC ) 10 MG tablet Take 10 mg by mouth daily.    [provider]  hydrochlorothiazide  (HYDRODIURIL ) 12.5 MG tablet Take 12.5 mg by mouth daily. 07/22/21   [provider]  Multiple Vitamins-Minerals (MULTIVITAMIN WITH MINERALS) tablet Take 1 tablet by mouth daily.    [provider]  rosuvastatin (CRESTOR) 10 MG tablet Take 1 tablet by mouth daily.    [provider]    Family History Family History  Problem Relation Age of Onset   Hypertension Mother    Hypertension Father    Hypertension Daughter     Social History Social History[1]   Allergies   Patient has no known allergies.   Review of Systems Review of Systems  Constitutional:  Negative for chills, fatigue and fever.  HENT:  Negative for congestion, rhinorrhea and sore throat.   Respiratory:  Negative for cough and shortness of breath.   Cardiovascular:  Negative for chest pain.  Gastrointestinal:  Negative  for abdominal pain, diarrhea and vomiting.  Genitourinary:  Positive for flank pain. Negative for decreased urine volume and dysuria.  Musculoskeletal:  Positive for myalgias. Negative for arthralgias.  Skin:  Negative for rash.  Neurological:  Negative for dizziness and headaches.  Psychiatric/Behavioral:  Negative for sleep disturbance.      Physical Exam Triage Vital Signs ED Triage Vitals  Encounter Vitals Group     BP 01/18/24 1919 (!) 174/82     Girls Systolic BP Percentile --      Girls Diastolic BP Percentile --      Boys Systolic BP Percentile --       Boys Diastolic BP Percentile --      Pulse Rate 01/18/24 1919 75     Resp 01/18/24 1919 20     Temp 01/18/24 1919 98.2 F (36.8 C)     Temp Source 01/18/24 1919 Oral     SpO2 01/18/24 1919 94 %     Weight --      Height --      Head Circumference --      Peak Flow --      Pain Score 01/18/24 1917 8     Pain Loc --      Pain Education --      Exclude from Growth Chart --    No data found.  Updated Vital Signs BP (!) 174/82 (BP Location: Left Arm)   Pulse 75   Temp 98.2 F (36.8 C) (Oral)   Resp 20   SpO2 94%   Physical Exam Vitals reviewed.  Constitutional:      Appearance: Normal appearance.  HENT:     Head: Normocephalic.  Cardiovascular:     Rate and Rhythm: Normal rate and regular rhythm.     Heart sounds: Normal heart sounds.  Pulmonary:     Effort: Pulmonary effort is normal.     Breath sounds: Normal breath sounds.  Abdominal:     General: Bowel sounds are normal.  Musculoskeletal:        General: Tenderness present.     Comments: Patient has discreet tenderness with palpation over the diffuse left flank and lateral left chest wall.  It worsens with movement.  Still able to flex, hyperextend, and rotate her torso.  Equal lower extremity strength.  No external skin changes noted over the affected area.  No lumbar/sacral bony spinal tenderness.  Neurological:     General: No focal deficit present.     Mental Status: She is alert and oriented to person, place, and time.     Comments: Ambulates independently without assistance.  Psychiatric:        Mood and Affect: Mood normal.        Behavior: Behavior normal.        Thought Content: Thought content normal.        Judgment: Judgment normal.    UC Treatments / Results  Labs (all labs ordered are listed, but only abnormal results are displayed) Labs Reviewed  POCT URINE DIPSTICK    EKG   Radiology No results found.  Procedures Procedures (including critical care time)  Medications Ordered in  UC Medications - No data to display  Initial Impression / Assessment and Plan / UC Course  I have reviewed the triage vital signs and the nursing notes.  Pertinent labs & imaging results that were available during my care of the patient were reviewed by me and considered in my medical decision making (see chart for details).  Patient presented for examination of left flank pain that has intensified over this past week after working out the gym.  She has taken Tylenol  arthritis without any alleviation of the discomfort.  The discomfort has been gradually worsening and seems to be exacerbated by movement.  No urinary symptoms POCT UA did not demonstrate new bladder concerns for UTI.  I discussed low-dose ibuprofen  alternating with Tylenol  over the next 2 days.  A prescription for Lidoderm  patches has been provided.  Also discussed application of warm compresses.  Advised abstaining from the gym until symptoms have improved.  Red flags were reviewed which would warrant return evaluation.  Final Clinical Impressions(s) / UC Diagnoses   Final diagnoses:  Acute left flank pain     Discharge Instructions      Your discomfort is likely a muscle strain related to your workout in the gym Your urine did not show blood or signs of an infection Take two ibuprofen  and alternate with your tylenol  arthritis for two days Apply the lidocaine  patch daily for 12 hours as needed Use heat compresses to soothe the muscle Monitor closely for any symptoms such as worsening pain, lower extremity numbness, loss of bowel/bladder, or abdominal pain.     ED Prescriptions     Medication Sig Dispense Auth. Provider   lidocaine  (LIDODERM ) 5 % Place 1 patch onto the skin daily. Only leave on skin for 12 hours at a time (12 hours on, 12 hours off). 30 patch Janet Therisa PARAS, FNP      PDMP not reviewed this encounter.    [1]  Social History Tobacco Use   Smoking status: Never   Smokeless tobacco: Never   Vaping Use   Vaping status: Never Used  Substance Use Topics   Alcohol use: No   Drug use: No     Janet Therisa PARAS, FNP 01/18/24 1956  "
